# Patient Record
Sex: Male | Born: 1935 | Race: White | Hispanic: No | Marital: Married | Smoking: Former smoker
Health system: Southern US, Community
[De-identification: ages and names within clinical notes are randomized; demographics above are authoritative.]

## PROBLEM LIST (undated history)

## (undated) DIAGNOSIS — I10 Essential (primary) hypertension: Secondary | ICD-10-CM

## (undated) DIAGNOSIS — N400 Enlarged prostate without lower urinary tract symptoms: Secondary | ICD-10-CM

## (undated) DIAGNOSIS — I6529 Occlusion and stenosis of unspecified carotid artery: Secondary | ICD-10-CM

## (undated) DIAGNOSIS — Z800 Family history of malignant neoplasm of digestive organs: Secondary | ICD-10-CM

## (undated) DIAGNOSIS — M1990 Unspecified osteoarthritis, unspecified site: Secondary | ICD-10-CM

## (undated) HISTORY — DX: Family history of malignant neoplasm of digestive organs: Z80.0

---

## 2014-03-31 DIAGNOSIS — M1990 Unspecified osteoarthritis, unspecified site: Secondary | ICD-10-CM | POA: Insufficient documentation

## 2014-04-09 DIAGNOSIS — I10 Essential (primary) hypertension: Secondary | ICD-10-CM | POA: Insufficient documentation

## 2014-04-09 DIAGNOSIS — E7800 Pure hypercholesterolemia, unspecified: Secondary | ICD-10-CM | POA: Insufficient documentation

## 2015-09-22 DIAGNOSIS — I10 Essential (primary) hypertension: Secondary | ICD-10-CM | POA: Diagnosis not present

## 2015-09-22 DIAGNOSIS — N401 Enlarged prostate with lower urinary tract symptoms: Secondary | ICD-10-CM | POA: Diagnosis not present

## 2015-09-22 DIAGNOSIS — R739 Hyperglycemia, unspecified: Secondary | ICD-10-CM | POA: Diagnosis not present

## 2015-09-22 DIAGNOSIS — E782 Mixed hyperlipidemia: Secondary | ICD-10-CM | POA: Diagnosis not present

## 2015-09-30 DIAGNOSIS — R7301 Impaired fasting glucose: Secondary | ICD-10-CM | POA: Diagnosis not present

## 2015-09-30 DIAGNOSIS — I10 Essential (primary) hypertension: Secondary | ICD-10-CM | POA: Diagnosis not present

## 2015-09-30 DIAGNOSIS — M1991 Primary osteoarthritis, unspecified site: Secondary | ICD-10-CM | POA: Diagnosis not present

## 2015-09-30 DIAGNOSIS — E782 Mixed hyperlipidemia: Secondary | ICD-10-CM | POA: Diagnosis not present

## 2015-09-30 DIAGNOSIS — R972 Elevated prostate specific antigen [PSA]: Secondary | ICD-10-CM | POA: Diagnosis not present

## 2015-09-30 DIAGNOSIS — N401 Enlarged prostate with lower urinary tract symptoms: Secondary | ICD-10-CM | POA: Diagnosis not present

## 2016-03-25 DIAGNOSIS — I10 Essential (primary) hypertension: Secondary | ICD-10-CM | POA: Diagnosis not present

## 2016-03-25 DIAGNOSIS — M1991 Primary osteoarthritis, unspecified site: Secondary | ICD-10-CM | POA: Diagnosis not present

## 2016-03-25 DIAGNOSIS — R739 Hyperglycemia, unspecified: Secondary | ICD-10-CM | POA: Diagnosis not present

## 2016-03-25 DIAGNOSIS — E782 Mixed hyperlipidemia: Secondary | ICD-10-CM | POA: Diagnosis not present

## 2016-03-25 DIAGNOSIS — E7800 Pure hypercholesterolemia, unspecified: Secondary | ICD-10-CM | POA: Diagnosis not present

## 2016-03-30 DIAGNOSIS — I10 Essential (primary) hypertension: Secondary | ICD-10-CM | POA: Diagnosis not present

## 2016-03-30 DIAGNOSIS — Z6822 Body mass index (BMI) 22.0-22.9, adult: Secondary | ICD-10-CM | POA: Diagnosis not present

## 2016-03-30 DIAGNOSIS — R7301 Impaired fasting glucose: Secondary | ICD-10-CM | POA: Diagnosis not present

## 2016-03-30 DIAGNOSIS — R972 Elevated prostate specific antigen [PSA]: Secondary | ICD-10-CM | POA: Diagnosis not present

## 2016-03-30 DIAGNOSIS — E782 Mixed hyperlipidemia: Secondary | ICD-10-CM | POA: Diagnosis not present

## 2016-03-30 DIAGNOSIS — M1991 Primary osteoarthritis, unspecified site: Secondary | ICD-10-CM | POA: Diagnosis not present

## 2016-03-30 DIAGNOSIS — N401 Enlarged prostate with lower urinary tract symptoms: Secondary | ICD-10-CM | POA: Diagnosis not present

## 2016-05-18 DIAGNOSIS — Z23 Encounter for immunization: Secondary | ICD-10-CM | POA: Diagnosis not present

## 2016-05-18 DIAGNOSIS — M1611 Unilateral primary osteoarthritis, right hip: Secondary | ICD-10-CM | POA: Diagnosis not present

## 2016-05-18 DIAGNOSIS — Z6821 Body mass index (BMI) 21.0-21.9, adult: Secondary | ICD-10-CM | POA: Diagnosis not present

## 2016-05-21 DIAGNOSIS — M1611 Unilateral primary osteoarthritis, right hip: Secondary | ICD-10-CM | POA: Diagnosis not present

## 2016-05-21 DIAGNOSIS — Z6821 Body mass index (BMI) 21.0-21.9, adult: Secondary | ICD-10-CM | POA: Diagnosis not present

## 2016-05-23 DIAGNOSIS — M1611 Unilateral primary osteoarthritis, right hip: Secondary | ICD-10-CM | POA: Diagnosis not present

## 2016-05-23 DIAGNOSIS — Z6822 Body mass index (BMI) 22.0-22.9, adult: Secondary | ICD-10-CM | POA: Diagnosis not present

## 2016-06-01 DIAGNOSIS — M545 Low back pain: Secondary | ICD-10-CM | POA: Diagnosis not present

## 2016-06-01 DIAGNOSIS — M1611 Unilateral primary osteoarthritis, right hip: Secondary | ICD-10-CM | POA: Diagnosis not present

## 2016-06-08 DIAGNOSIS — M47816 Spondylosis without myelopathy or radiculopathy, lumbar region: Secondary | ICD-10-CM | POA: Diagnosis not present

## 2016-06-08 DIAGNOSIS — S336XXA Sprain of sacroiliac joint, initial encounter: Secondary | ICD-10-CM | POA: Diagnosis not present

## 2016-06-08 DIAGNOSIS — M9903 Segmental and somatic dysfunction of lumbar region: Secondary | ICD-10-CM | POA: Diagnosis not present

## 2016-06-10 DIAGNOSIS — M9903 Segmental and somatic dysfunction of lumbar region: Secondary | ICD-10-CM | POA: Diagnosis not present

## 2016-06-10 DIAGNOSIS — M47816 Spondylosis without myelopathy or radiculopathy, lumbar region: Secondary | ICD-10-CM | POA: Diagnosis not present

## 2016-06-10 DIAGNOSIS — S336XXA Sprain of sacroiliac joint, initial encounter: Secondary | ICD-10-CM | POA: Diagnosis not present

## 2016-06-15 DIAGNOSIS — M9903 Segmental and somatic dysfunction of lumbar region: Secondary | ICD-10-CM | POA: Diagnosis not present

## 2016-06-15 DIAGNOSIS — S336XXA Sprain of sacroiliac joint, initial encounter: Secondary | ICD-10-CM | POA: Diagnosis not present

## 2016-06-15 DIAGNOSIS — M47816 Spondylosis without myelopathy or radiculopathy, lumbar region: Secondary | ICD-10-CM | POA: Diagnosis not present

## 2016-06-17 DIAGNOSIS — M47816 Spondylosis without myelopathy or radiculopathy, lumbar region: Secondary | ICD-10-CM | POA: Diagnosis not present

## 2016-06-17 DIAGNOSIS — M9903 Segmental and somatic dysfunction of lumbar region: Secondary | ICD-10-CM | POA: Diagnosis not present

## 2016-06-17 DIAGNOSIS — S336XXA Sprain of sacroiliac joint, initial encounter: Secondary | ICD-10-CM | POA: Diagnosis not present

## 2016-06-20 DIAGNOSIS — M1991 Primary osteoarthritis, unspecified site: Secondary | ICD-10-CM | POA: Diagnosis not present

## 2016-06-20 DIAGNOSIS — R634 Abnormal weight loss: Secondary | ICD-10-CM | POA: Diagnosis not present

## 2016-06-20 DIAGNOSIS — R739 Hyperglycemia, unspecified: Secondary | ICD-10-CM | POA: Diagnosis not present

## 2016-06-20 DIAGNOSIS — I10 Essential (primary) hypertension: Secondary | ICD-10-CM | POA: Diagnosis not present

## 2016-06-20 DIAGNOSIS — Z6821 Body mass index (BMI) 21.0-21.9, adult: Secondary | ICD-10-CM | POA: Diagnosis not present

## 2016-06-21 DIAGNOSIS — M9903 Segmental and somatic dysfunction of lumbar region: Secondary | ICD-10-CM | POA: Diagnosis not present

## 2016-06-21 DIAGNOSIS — M47816 Spondylosis without myelopathy or radiculopathy, lumbar region: Secondary | ICD-10-CM | POA: Diagnosis not present

## 2016-06-21 DIAGNOSIS — S336XXA Sprain of sacroiliac joint, initial encounter: Secondary | ICD-10-CM | POA: Diagnosis not present

## 2016-06-28 DIAGNOSIS — M47816 Spondylosis without myelopathy or radiculopathy, lumbar region: Secondary | ICD-10-CM | POA: Diagnosis not present

## 2016-06-28 DIAGNOSIS — S336XXA Sprain of sacroiliac joint, initial encounter: Secondary | ICD-10-CM | POA: Diagnosis not present

## 2016-06-28 DIAGNOSIS — M9903 Segmental and somatic dysfunction of lumbar region: Secondary | ICD-10-CM | POA: Diagnosis not present

## 2016-07-05 DIAGNOSIS — M533 Sacrococcygeal disorders, not elsewhere classified: Secondary | ICD-10-CM | POA: Diagnosis not present

## 2016-07-19 DIAGNOSIS — M47816 Spondylosis without myelopathy or radiculopathy, lumbar region: Secondary | ICD-10-CM | POA: Diagnosis not present

## 2016-07-19 DIAGNOSIS — M533 Sacrococcygeal disorders, not elsewhere classified: Secondary | ICD-10-CM | POA: Diagnosis not present

## 2016-07-19 DIAGNOSIS — M5431 Sciatica, right side: Secondary | ICD-10-CM | POA: Diagnosis not present

## 2016-07-21 DIAGNOSIS — M545 Low back pain: Secondary | ICD-10-CM | POA: Diagnosis not present

## 2016-07-21 DIAGNOSIS — M47816 Spondylosis without myelopathy or radiculopathy, lumbar region: Secondary | ICD-10-CM | POA: Diagnosis not present

## 2016-07-21 DIAGNOSIS — M47817 Spondylosis without myelopathy or radiculopathy, lumbosacral region: Secondary | ICD-10-CM | POA: Diagnosis not present

## 2016-07-21 DIAGNOSIS — M79604 Pain in right leg: Secondary | ICD-10-CM | POA: Diagnosis not present

## 2016-07-21 DIAGNOSIS — M48061 Spinal stenosis, lumbar region without neurogenic claudication: Secondary | ICD-10-CM | POA: Diagnosis not present

## 2016-07-21 DIAGNOSIS — M4807 Spinal stenosis, lumbosacral region: Secondary | ICD-10-CM | POA: Diagnosis not present

## 2016-07-26 DIAGNOSIS — M5431 Sciatica, right side: Secondary | ICD-10-CM | POA: Diagnosis not present

## 2016-07-26 DIAGNOSIS — M47816 Spondylosis without myelopathy or radiculopathy, lumbar region: Secondary | ICD-10-CM | POA: Diagnosis not present

## 2016-08-09 DIAGNOSIS — M5416 Radiculopathy, lumbar region: Secondary | ICD-10-CM | POA: Diagnosis not present

## 2016-08-09 DIAGNOSIS — M5431 Sciatica, right side: Secondary | ICD-10-CM | POA: Diagnosis not present

## 2016-08-09 DIAGNOSIS — M545 Low back pain: Secondary | ICD-10-CM | POA: Diagnosis not present

## 2016-08-24 DIAGNOSIS — M47816 Spondylosis without myelopathy or radiculopathy, lumbar region: Secondary | ICD-10-CM | POA: Diagnosis not present

## 2016-08-24 DIAGNOSIS — M5416 Radiculopathy, lumbar region: Secondary | ICD-10-CM | POA: Diagnosis not present

## 2016-08-24 DIAGNOSIS — M5431 Sciatica, right side: Secondary | ICD-10-CM | POA: Diagnosis not present

## 2016-08-29 DIAGNOSIS — M1711 Unilateral primary osteoarthritis, right knee: Secondary | ICD-10-CM | POA: Diagnosis not present

## 2016-08-29 DIAGNOSIS — M545 Low back pain: Secondary | ICD-10-CM | POA: Diagnosis not present

## 2016-09-20 DIAGNOSIS — M5431 Sciatica, right side: Secondary | ICD-10-CM | POA: Diagnosis not present

## 2016-09-20 DIAGNOSIS — M47816 Spondylosis without myelopathy or radiculopathy, lumbar region: Secondary | ICD-10-CM | POA: Diagnosis not present

## 2016-09-28 DIAGNOSIS — E7800 Pure hypercholesterolemia, unspecified: Secondary | ICD-10-CM | POA: Diagnosis not present

## 2016-09-28 DIAGNOSIS — E782 Mixed hyperlipidemia: Secondary | ICD-10-CM | POA: Diagnosis not present

## 2016-09-28 DIAGNOSIS — I10 Essential (primary) hypertension: Secondary | ICD-10-CM | POA: Diagnosis not present

## 2016-09-28 DIAGNOSIS — R739 Hyperglycemia, unspecified: Secondary | ICD-10-CM | POA: Diagnosis not present

## 2016-09-29 DIAGNOSIS — E782 Mixed hyperlipidemia: Secondary | ICD-10-CM | POA: Insufficient documentation

## 2016-09-30 DIAGNOSIS — R630 Anorexia: Secondary | ICD-10-CM | POA: Diagnosis not present

## 2016-09-30 DIAGNOSIS — Z6821 Body mass index (BMI) 21.0-21.9, adult: Secondary | ICD-10-CM | POA: Diagnosis not present

## 2016-09-30 DIAGNOSIS — R972 Elevated prostate specific antigen [PSA]: Secondary | ICD-10-CM | POA: Diagnosis not present

## 2016-09-30 DIAGNOSIS — N401 Enlarged prostate with lower urinary tract symptoms: Secondary | ICD-10-CM | POA: Diagnosis not present

## 2016-09-30 DIAGNOSIS — R7301 Impaired fasting glucose: Secondary | ICD-10-CM | POA: Diagnosis not present

## 2016-09-30 DIAGNOSIS — R5383 Other fatigue: Secondary | ICD-10-CM | POA: Diagnosis not present

## 2016-09-30 DIAGNOSIS — M1991 Primary osteoarthritis, unspecified site: Secondary | ICD-10-CM | POA: Diagnosis not present

## 2016-09-30 DIAGNOSIS — D649 Anemia, unspecified: Secondary | ICD-10-CM | POA: Diagnosis not present

## 2016-09-30 DIAGNOSIS — I10 Essential (primary) hypertension: Secondary | ICD-10-CM | POA: Diagnosis not present

## 2016-09-30 DIAGNOSIS — Z1212 Encounter for screening for malignant neoplasm of rectum: Secondary | ICD-10-CM | POA: Diagnosis not present

## 2016-09-30 DIAGNOSIS — E782 Mixed hyperlipidemia: Secondary | ICD-10-CM | POA: Diagnosis not present

## 2016-10-06 DIAGNOSIS — M47816 Spondylosis without myelopathy or radiculopathy, lumbar region: Secondary | ICD-10-CM | POA: Diagnosis not present

## 2016-10-06 DIAGNOSIS — M5431 Sciatica, right side: Secondary | ICD-10-CM | POA: Diagnosis not present

## 2016-10-06 DIAGNOSIS — M5416 Radiculopathy, lumbar region: Secondary | ICD-10-CM | POA: Diagnosis not present

## 2016-10-06 DIAGNOSIS — M545 Low back pain: Secondary | ICD-10-CM | POA: Diagnosis not present

## 2016-10-25 DIAGNOSIS — M47816 Spondylosis without myelopathy or radiculopathy, lumbar region: Secondary | ICD-10-CM | POA: Diagnosis not present

## 2016-10-25 DIAGNOSIS — M5431 Sciatica, right side: Secondary | ICD-10-CM | POA: Diagnosis not present

## 2016-11-25 DIAGNOSIS — Z6821 Body mass index (BMI) 21.0-21.9, adult: Secondary | ICD-10-CM | POA: Diagnosis not present

## 2016-11-25 DIAGNOSIS — K921 Melena: Secondary | ICD-10-CM | POA: Diagnosis not present

## 2016-11-25 DIAGNOSIS — R634 Abnormal weight loss: Secondary | ICD-10-CM | POA: Diagnosis not present

## 2016-11-28 ENCOUNTER — Encounter: Payer: Self-pay | Admitting: Internal Medicine

## 2016-11-28 DIAGNOSIS — R634 Abnormal weight loss: Secondary | ICD-10-CM | POA: Diagnosis not present

## 2016-11-28 DIAGNOSIS — K921 Melena: Secondary | ICD-10-CM | POA: Diagnosis not present

## 2016-11-29 DIAGNOSIS — M48061 Spinal stenosis, lumbar region without neurogenic claudication: Secondary | ICD-10-CM | POA: Diagnosis not present

## 2016-11-29 DIAGNOSIS — M47816 Spondylosis without myelopathy or radiculopathy, lumbar region: Secondary | ICD-10-CM | POA: Diagnosis not present

## 2016-12-19 ENCOUNTER — Telehealth: Payer: Self-pay

## 2016-12-19 ENCOUNTER — Encounter: Payer: Self-pay | Admitting: Nurse Practitioner

## 2016-12-19 ENCOUNTER — Other Ambulatory Visit: Payer: Self-pay

## 2016-12-19 ENCOUNTER — Ambulatory Visit (INDEPENDENT_AMBULATORY_CARE_PROVIDER_SITE_OTHER): Payer: PPO | Admitting: Nurse Practitioner

## 2016-12-19 DIAGNOSIS — D649 Anemia, unspecified: Secondary | ICD-10-CM | POA: Diagnosis not present

## 2016-12-19 DIAGNOSIS — K625 Hemorrhage of anus and rectum: Secondary | ICD-10-CM | POA: Diagnosis not present

## 2016-12-19 DIAGNOSIS — R634 Abnormal weight loss: Secondary | ICD-10-CM | POA: Diagnosis not present

## 2016-12-19 MED ORDER — PEG 3350-KCL-NA BICARB-NACL 420 G PO SOLR: 4000.0000 mL | ORAL | 0 refills | Status: DC

## 2016-12-19 NOTE — Patient Instructions (Signed)
1. We will schedule your colonoscopy and possible upper endoscopy for you. 2. Have your blood work drawn when you're able to. 3. We will call you with your blood work results. 4. Return for follow-up in 3 months. 5. Call us if you have any worsening or severe symptoms.

## 2016-12-19 NOTE — Progress Notes (Signed)
Primary Care Physician:  Manon Hilding, MD Primary Gastroenterologist:  Dr. Gala Romney  Chief Complaint  Patient presents with  . Weight Loss  . Anemia    HPI:   [name] is a 83 y.o. male who presents On referral from primary care for weight loss and anemia as well as heme positive stool. PCP notes reviewed. The patient last saw primary care on 11/25/2016 for bad indigestion, heme positive stool 4 episodes in 1-1/2 hours which is described as "pure blood the first 3 times and nothing the fourth time." Was brighter with the first episode and darker the second and third times. Chronic indigestion. Has never had a colonoscopy. Labs included with PCP notes include normal TSH, CBC documenting anemia with a hemoglobin of 12.3.  Today he states he's doing well overall. Has not seen any rectal bleeding since the episode in early April. None before as well. He lost about 10 lb in 2 months but has gained it back. Does have significant fatigue. Admits he was told he was anemic. Denies melena, abdominal pain, N/V, GERD. Has a bowel movement about ever 2 days now on oxycodone, previously went every day prior to pain medications. Stools are not really harder but they are difficult to pass. Denies chest pain, dyspnea, dizziness, lightheadedness, syncope, near syncope. Denies any other upper or lower GI symptoms.  Has never had a colonoscopy before.  No past medical history on file.  No past surgical history on file.  Current Outpatient Prescriptions  Medication Sig Dispense Refill  . aspirin 325 MG tablet Take 1 tablet by mouth daily.    Marland Kitchen HYDROcodone-acetaminophen (NORCO) 7.5-325 MG tablet Take 1 tablet by mouth every 6 (six) hours as needed.    Marland Kitchen losartan (COZAAR) 100 MG tablet Take 100 mg by mouth daily.    . metoprolol (LOPRESSOR) 50 MG tablet metoprolol tartrate 50 mg tablet    . Omega-3 Fatty Acids (FISH OIL) 1000 MG CAPS Take 1 tablet by mouth daily.     No current facility-administered  medications for this visit.     Allergies as of 12/19/2016  . (No Known Allergies)    Family History  Problem Relation Age of Onset  . Colon cancer Neg Hx   . Colon polyps Neg Hx     Social History   Social History  . Marital status: Married    Spouse name: N/A  . Number of children: N/A  . Years of education: N/A   Occupational History  . Not on file.   Social History Main Topics  . Smoking status: Former Smoker    Quit date: 1980  . Smokeless tobacco: Never Used  . Alcohol use No  . Drug use: No  . Sexual activity: No   Other Topics Concern  . Not on file   Social History Narrative  . No narrative on file    Review of Systems: General: Negative for anorexia, fever, chills, fatigue, weakness. Eyes: Negative for vision changes.  ENT: Negative for hoarseness, difficulty swallowing. CV: Negative for chest pain, angina, palpitations, peripheral edema.  Respiratory: Negative for dyspnea at rest, cough, sputum, wheezing.  GI: See history of present illness. MS: Pain from bulging disc.  Derm: Negative for rash or itching.  Endo: Negative for unusual weight change.  Heme: Negative for bruising or bleeding. Allergy: Negative for rash or hives.    Physical Exam: BP (!) 153/75   Pulse 63   Temp 98 F (36.7 C) (Oral)  Ht 5\' 11"  (1.803 m)   Wt 151 lb 12.8 oz (68.9 kg)   BMI 21.17 kg/m  General:   Alert and oriented. Pleasant and cooperative. Well-nourished and well-developed. Young appearing/acting 83 years old. Head:  Normocephalic and atraumatic. Eyes:  Without icterus, sclera clear and conjunctiva pink.  Ears:  Normal auditory acuity. Cardiovascular:  S1, S2 present without murmurs appreciated. Normal pulses noted. Extremities without clubbing or edema. Respiratory:  Clear to auscultation bilaterally. No wheezes, rales, or rhonchi. No distress.  Gastrointestinal:  +BS, soft, non-tender and non-distended. No HSM noted. No guarding or rebound. No masses  appreciated.  Rectal:  Deferred  Musculoskalatal:  Symmetrical without gross deformities. Skin:  Intact without significant lesions or rashes. Neurologic:  Alert and oriented x4;  grossly normal neurologically. Psych:  Alert and cooperative. Normal mood and affect. Heme/Lymph/Immune: No excessive bruising noted.    12/19/2016 9:58 AM   Disclaimer: This note was dictated with voice recognition software. Similar sounding words can inadvertently be transcribed and may not be corrected upon review.

## 2016-12-19 NOTE — Assessment & Plan Note (Signed)
The patient notes he did have about a 10 pound weight loss over 23 months, but feels he has put this weight back on. In the setting of anemia, rectal bleeding, his weight loss is also concerning. May have been due to dietary changes or incidental weight loss and regain. Colonoscopy and possible upper endoscopy as per below for further evaluation. Return for follow-up in 3 months. Continue to monitor her weight

## 2016-12-19 NOTE — Assessment & Plan Note (Addendum)
The patient has noted anemia with a hemoglobin of 12.3. He has had 3 episodes of rectal bleeding. His anemia is normocytic and normochromic which does not appear to be due to chronic blood loss. I will recheck his CBC today as well as iron studies. Because of his anemia we will add on the possibility of upper endoscopy, if needed as determined on the day of procedure if no further explanation for anemia found on colonoscopy. He was previously taking ibuprofen. He currently takes aspirin 325 mg daily. There is a possibility of NSAID gastritis, gastric ulcer, duodenal ulcer, duodenitis, gastritis. Less likely gastric cancer. Return for follow-up in 3 months.  Proceed with +/- EGD at the time of colonoscopy with Dr. Gala Romney in near future: the risks, benefits, and alternatives have been discussed with the patient in detail. The patient states understanding and desires to proceed.   The patient is currently on hydrocodone 7.5 mg. No other anticoagulants, anxiolytics, chronic pain medications, or antidepressants. He is 83 years old and small in stature. Conscious sedation should be likely adequate for his procedure despite pain medicines which she takes about twice a day.

## 2016-12-19 NOTE — Telephone Encounter (Signed)
Received fax from [facility]. TCS/+/-EGD approved. Auth# [id number], 01/19/17-04/19/17.

## 2016-12-19 NOTE — Assessment & Plan Note (Addendum)
The patient has had rectal bleeding for 3 episodes in 1 day. No bleeding prior and no bleeding after. Does have some mild constipation symptoms. He is also found to be a little bit anemic and initially had about 10 pound weight loss in 2-3 months but states he has gained this back. He is never had a colonoscopy before. Rectal bleeding differentials include hemorrhoid bleeding in the setting of constipation, other benign anorectal source. However, unable to exclude polyps, colorectal cancer, or other more insidious pathology. We will plan for colonoscopy at this time.  Proceed with TCS with Dr. Gala Romney in near future: the risks, benefits, and alternatives have been discussed with the patient in detail. The patient states understanding and desires to proceed.  The patient is currently on hydrocodone 7.5 mg. No other anticoagulants, anxiolytics, chronic pain medications, or antidepressants. He is 83 years old and small in stature. Conscious sedation should be likely adequate for his procedure despite pain medicines which she takes about twice a day.

## 2016-12-19 NOTE — Telephone Encounter (Signed)
PA info for TCS/+/-EGD faxed to [facility] [employer].

## 2016-12-19 NOTE — Progress Notes (Signed)
cc'ed to pcp °

## 2016-12-29 ENCOUNTER — Telehealth: Payer: Self-pay | Admitting: Nurse Practitioner

## 2016-12-29 NOTE — Telephone Encounter (Signed)
Please tell the patient received his labs back. His blood cell counts looked normal with a normal hemoglobin, no sign of infection, normal iron studies. Keep plan for scheduled colonoscopy. Call if any questions or concerns.

## 2017-01-04 NOTE — Telephone Encounter (Signed)
Patient made aware of EG's recommendations

## 2017-01-19 ENCOUNTER — Ambulatory Visit (HOSPITAL_COMMUNITY)
Admission: RE | Admit: 2017-01-19 | Discharge: 2017-01-19 | Disposition: A | Discharge status: Home / Self Care | Payer: PPO | Source: Ambulatory Visit | Attending: Internal Medicine | Admitting: Internal Medicine

## 2017-01-19 ENCOUNTER — Encounter (HOSPITAL_COMMUNITY): Payer: Self-pay

## 2017-01-19 ENCOUNTER — Encounter (HOSPITAL_COMMUNITY)
Admission: RE | Disposition: A | Discharge status: Home / Self Care | Payer: Self-pay | Source: Ambulatory Visit | Attending: Internal Medicine

## 2017-01-19 DIAGNOSIS — K3189 Other diseases of stomach and duodenum: Secondary | ICD-10-CM

## 2017-01-19 DIAGNOSIS — K921 Melena: Secondary | ICD-10-CM

## 2017-01-19 DIAGNOSIS — K254 Chronic or unspecified gastric ulcer with hemorrhage: Secondary | ICD-10-CM | POA: Diagnosis not present

## 2017-01-19 DIAGNOSIS — I10 Essential (primary) hypertension: Secondary | ICD-10-CM | POA: Diagnosis not present

## 2017-01-19 DIAGNOSIS — Z87891 Personal history of nicotine dependence: Secondary | ICD-10-CM | POA: Diagnosis not present

## 2017-01-19 DIAGNOSIS — Z79899 Other long term (current) drug therapy: Secondary | ICD-10-CM | POA: Diagnosis not present

## 2017-01-19 DIAGNOSIS — Z8371 Family history of colonic polyps: Secondary | ICD-10-CM | POA: Diagnosis not present

## 2017-01-19 DIAGNOSIS — K625 Hemorrhage of anus and rectum: Secondary | ICD-10-CM

## 2017-01-19 DIAGNOSIS — K2950 Unspecified chronic gastritis without bleeding: Secondary | ICD-10-CM | POA: Diagnosis not present

## 2017-01-19 DIAGNOSIS — Z7982 Long term (current) use of aspirin: Secondary | ICD-10-CM | POA: Diagnosis not present

## 2017-01-19 DIAGNOSIS — K5730 Diverticulosis of large intestine without perforation or abscess without bleeding: Secondary | ICD-10-CM | POA: Insufficient documentation

## 2017-01-19 DIAGNOSIS — R634 Abnormal weight loss: Secondary | ICD-10-CM | POA: Diagnosis not present

## 2017-01-19 DIAGNOSIS — D649 Anemia, unspecified: Secondary | ICD-10-CM | POA: Insufficient documentation

## 2017-01-19 DIAGNOSIS — B9681 Helicobacter pylori [H. pylori] as the cause of diseases classified elsewhere: Secondary | ICD-10-CM | POA: Insufficient documentation

## 2017-01-19 HISTORY — PX: COLONOSCOPY: SHX5424

## 2017-01-19 HISTORY — DX: Essential (primary) hypertension: I10

## 2017-01-19 HISTORY — PX: ESOPHAGOGASTRODUODENOSCOPY: SHX5428

## 2017-01-19 SURGERY — COLONOSCOPY
Anesthesia: Moderate Sedation

## 2017-01-19 MED ORDER — MIDAZOLAM HCL 5 MG/5ML IJ SOLN
INTRAMUSCULAR | Status: DC | PRN
Start: 2017-01-19 — End: 2017-01-19
  Administered 2017-01-19: 2 mg via INTRAVENOUS
  Administered 2017-01-19 (×3): 1 mg via INTRAVENOUS

## 2017-01-19 MED ORDER — ONDANSETRON HCL 4 MG/2ML IJ SOLN
INTRAMUSCULAR | Status: AC
  Filled 2017-01-19: qty 2

## 2017-01-19 MED ORDER — MEPERIDINE HCL 100 MG/ML IJ SOLN
INTRAMUSCULAR | Status: DC | PRN
  Administered 2017-01-19 (×2): 25 mg via INTRAVENOUS
  Administered 2017-01-19: 50 mg via INTRAVENOUS

## 2017-01-19 MED ORDER — STERILE WATER FOR IRRIGATION IR SOLN
Status: DC | PRN
  Administered 2017-01-19: 13:00:00

## 2017-01-19 MED ORDER — MIDAZOLAM HCL 5 MG/5ML IJ SOLN
INTRAMUSCULAR | Status: AC
  Filled 2017-01-19: qty 10

## 2017-01-19 MED ORDER — ONDANSETRON HCL 4 MG/2ML IJ SOLN
INTRAMUSCULAR | Status: DC | PRN
  Administered 2017-01-19: 4 mg via INTRAVENOUS

## 2017-01-19 MED ORDER — LIDOCAINE VISCOUS 2 % MT SOLN
OROMUCOSAL | Status: DC | PRN
  Administered 2017-01-19: 5 mL via OROMUCOSAL

## 2017-01-19 MED ORDER — LIDOCAINE VISCOUS 2 % MT SOLN
OROMUCOSAL | Status: AC
  Filled 2017-01-19: qty 15

## 2017-01-19 MED ORDER — SODIUM CHLORIDE 0.9 % IV SOLN
INTRAVENOUS | Status: DC
  Administered 2017-01-19: 1000 mL via INTRAVENOUS

## 2017-01-19 MED ORDER — MEPERIDINE HCL 100 MG/ML IJ SOLN
INTRAMUSCULAR | Status: AC
  Filled 2017-01-19: qty 2

## 2017-01-19 NOTE — H&P (Signed)
@  HFWY@   Primary Care Physician:  Manon Hilding, MD Primary Gastroenterologist:  Dr. Gala Romney  Pre-Procedure History & Physical: HPI:  Andrew [name] is a 83 y.o. male here for for evaluation of self-limiting hematochezia. No bleeding since seen in the office recently. He is here for colonoscopy and possible EGD.  Past Medical History:  Diagnosis Date  . Hypertension     History reviewed. No pertinent surgical history.  Prior to Admission medications   Medication Sig Start Date End Date Taking? Authorizing Provider  aspirin 325 MG tablet Take 325 mg by mouth daily.    Yes [provider]  HYDROcodone-acetaminophen (NORCO) 7.5-325 MG tablet Take 1 tablet by mouth 2 (two) times daily as needed for moderate pain.  12/15/16  Yes [provider]  losartan (COZAAR) 100 MG tablet Take 100 mg by mouth at bedtime.    Yes [provider]  metoprolol (LOPRESSOR) 50 MG tablet Take 50 mgs by mouth once daily 09/30/16 09/24/17 Yes [provider]  Omega-3 Fatty Acids (FISH OIL) 1000 MG CAPS Take 1,000 mg by mouth 3 (three) times daily.    Yes [provider]  polyethylene glycol-electrolytes (TRILYTE) 420 g solution Take 4,000 mLs by mouth as directed. 12/19/16  Yes Daneil Dolin, MD    Allergies as of 12/19/2016  . (No Known Allergies)    Family History  Problem Relation Age of Onset  . Colon cancer Neg Hx   . Colon polyps Neg Hx     Social History   Social History  . Marital status: Married    Spouse name: N/A  . Number of children: N/A  . Years of education: N/A   Occupational History  . Not on file.   Social History Main Topics  . Smoking status: Former Smoker    Quit date: 1980  . Smokeless tobacco: Never Used  . Alcohol use No  . Drug use: No  . Sexual activity: No   Other Topics Concern  . Not on file   Social History Narrative  . No narrative on file    Review of Systems: See HPI, otherwise negative ROS  Physical  Exam: BP (!) 183/80   Pulse 79   Temp 98.7 F (37.1 C) (Oral)   Resp 16   SpO2 100%  General:   Alert,  Well-developed, well-nourished, pleasant and cooperative in NAD Lungs:  Clear throughout to auscultation.   No wheezes, crackles, or rhonchi. No acute distress. Heart:  Regular rate and rhythm; no murmurs, clicks, rubs,  or gallops. Abdomen: Non-distended, normal bowel sounds.  Soft and nontender without appreciable mass or hepatosplenomegaly.  Pulses:  Normal pulses noted. Extremities:  Without clubbing or edema.  Impression:  Pleasant 83 year old gentleman with self-limiting bout of rectal bleeding. He is anemic.  Plan for colonoscopy and possible EGD to follow for office visit.   Recommendations:  I have offered the patient a colonoscopy and possible EGD to follow.  The risks, benefits, limitations, imponderables and alternatives regarding both EGD and colonoscopy have been reviewed with the patient. Questions have been answered. All parties agreeable.          Notice: This dictation was prepared with Dragon dictation along with smaller phrase technology. Any transcriptional errors that result from this process are unintentional and may not be corrected upon review.

## 2017-01-19 NOTE — Discharge Instructions (Addendum)
°Colonoscopy °Discharge Instructions ° °Read the instructions outlined below and refer to this sheet in the next few weeks. These discharge instructions provide you with general information on caring for yourself after you leave the hospital. Your doctor may also give you specific instructions. While your treatment has been planned according to the most current medical practices available, unavoidable complications occasionally occur. If you have any problems or questions after discharge, call Dr. Rourk at [phone]. °ACTIVITY °· You may resume your regular activity, but move at a slower pace for the next 24 hours.  °· Take frequent rest periods for the next 24 hours.  °· Walking will help get rid of the air and reduce the bloated feeling in your belly (abdomen).  °· No driving for 24 hours (because of the medicine (anesthesia) used during the test).   °· Do not sign any important legal documents or operate any machinery for 24 hours (because of the anesthesia used during the test).  °NUTRITION °· Drink plenty of fluids.  °· You may resume your normal diet as instructed by your doctor.  °· Begin with a light meal and progress to your normal diet. Heavy or fried foods are harder to digest and may make you feel sick to your stomach (nauseated).  °· Avoid alcoholic beverages for 24 hours or as instructed.  °MEDICATIONS °· You may resume your normal medications unless your doctor tells you otherwise.  °WHAT YOU CAN EXPECT TODAY °· Some feelings of bloating in the abdomen.  °· Passage of more gas than usual.  °· Spotting of blood in your stool or on the toilet paper.  °IF YOU HAD POLYPS REMOVED DURING THE COLONOSCOPY: °· No aspirin products for 7 days or as instructed.  °· No alcohol for 7 days or as instructed.  °· Eat a soft diet for the next 24 hours.  °FINDING OUT THE RESULTS OF YOUR TEST °Not all test results are available during your visit. If your test results are not back during the visit, make an appointment  with your caregiver to find out the results. Do not assume everything is normal if you have not heard from your caregiver or the medical facility. It is important for you to follow up on all of your test results.  °SEEK IMMEDIATE MEDICAL ATTENTION IF: °· You have more than a spotting of blood in your stool.  °· Your belly is swollen (abdominal distention).  °· You are nauseated or vomiting.  °· You have a temperature over 101.  °· You have abdominal pain or discomfort that is severe or gets worse throughout the day.  °EGD °Discharge instructions °Please read the instructions outlined below and refer to this sheet in the next few weeks. These discharge instructions provide you with general information on caring for yourself after you leave the hospital. Your doctor may also give you specific instructions. While your treatment has been planned according to the most current medical practices available, unavoidable complications occasionally occur. If you have any problems or questions after discharge, please call your doctor. °ACTIVITY °· You may resume your regular activity but move at a slower pace for the next 24 hours.  °· Take frequent rest periods for the next 24 hours.  °· Walking will help expel (get rid of) the air and reduce the bloated feeling in your abdomen.  °· No driving for 24 hours (because of the anesthesia (medicine) used during the test).  °· You may shower.  °· Do not sign any important   legal documents or operate any machinery for 24 hours (because of the anesthesia used during the test).  NUTRITION  Drink plenty of fluids.   You may resume your normal diet.   Begin with a light meal and progress to your normal diet.   Avoid alcoholic beverages for 24 hours or as instructed by your caregiver.  MEDICATIONS  You may resume your normal medications unless your caregiver tells you otherwise.  WHAT YOU CAN EXPECT TODAY  You may experience abdominal discomfort such as a feeling of fullness  or gas pains.  FOLLOW-UP  Your doctor will discuss the results of your test with you.  SEEK IMMEDIATE MEDICAL ATTENTION IF ANY OF THE FOLLOWING OCCUR:  Excessive nausea (feeling sick to your stomach) and/or vomiting.   Severe abdominal pain and distention (swelling).   Trouble swallowing.   Temperature over 101 F (37.8 C).   Rectal bleeding or vomiting of blood.    Peptic ulcer disease information provided  Please avoid NSAIDS like Advil or Aleve and ibuprofen  Begin Protonix 40 mg twice daily  I do not recommend future colonoscopy unless the symptoms developed  Office visit with [place] in 3 months   Peptic Ulcer A peptic ulcer is a painful sore in the lining of your esophagus, stomach, or the first part of your small intestine. You may have pain in the area between your chest and your belly button. The most common causes of an ulcer are:  An infection.  Using certain pain medicines too often or too much.  Follow these instructions at home:  Avoid alcohol.  Avoid caffeine.  Do not use any tobacco products. These include cigarettes, chewing tobacco, and e-cigarettes. If you need help quitting, ask your doctor.  Take over-the-counter and prescription medicines only as told by your doctor. Do not stop or change your medicines unless you talk with your doctor about it first.  Keep all follow-up visits as told by your doctor. This is important. Contact a doctor if:  You do not get better in 7 days after you start treatment.  You keep having an upset stomach (indigestion) or heartburn. Get help right away if:  You have sudden, sharp pain in your belly (abdomen).  You have lasting belly pain.  You have bloody poop (stool) or black, tarry poop.  You throw up (vomit) blood. It may look like coffee grounds.  You feel light-headed or feel like you may pass out (faint).  You get weak.  You get sweaty or feel sticky and cold to the touch (clammy). This information  is not intended to replace advice given to you by your health care provider. Make sure you discuss any questions you have with your health care provider. Document Released: 11/02/2009 Document Revised: 12/23/2015 Document Reviewed: 05/09/2015 Elsevier Interactive Patient Education  [employer].

## 2017-01-19 NOTE — Op Note (Signed)
[facility] Patient Name: Andrew [name] Procedure Date: 01/19/2017 11:32 AM MRN: [id number] Date of Birth: Mar 23, 1931 Attending MD: Norvel Richards , MD CSN: [id number] Age: 83 Admit Type: Outpatient Procedure:                Upper GI endoscopy Indications:              Weight loss Providers:                Norvel Richards, MD, Hinton Rao, RN, Aram Candela Referring MD:              Medicines:                Midazolam 7 mg IV, Meperidine 125 mg IV,                            Ondansetron 4 mg IV Complications:            No immediate complications. Estimated Blood Loss:     Estimated blood loss was minimal. Procedure:                Pre-Anesthesia Assessment:                           - Prior to the procedure, a History and Physical                            was performed, and patient medications and                            allergies were reviewed. The patient's tolerance of                            previous anesthesia was also reviewed. The risks                            and benefits of the procedure and the sedation                            options and risks were discussed with the patient.                            All questions were answered, and informed consent                            was obtained. Prior Anticoagulants: The patient has                            taken no previous anticoagulant or antiplatelet                            agents. ASA Grade Assessment: III - A patient with  severe systemic disease. After reviewing the risks                            and benefits, the patient was deemed in                            satisfactory condition to undergo the procedure.                           After obtaining informed consent, the endoscope was                            passed under direct vision. Throughout the                            procedure, the patient's blood pressure, pulse,  and                            oxygen saturations were monitored continuously. The                            [id number] ([id number]) scope was introduced through the                            mouth, and advanced to the second part of duodenum.                            The upper GI endoscopy was accomplished without                            difficulty. The patient tolerated the procedure                            well. The upper GI endoscopy was accomplished                            without difficulty. The patient tolerated the                            procedure well. Scope In: 1:21:34 PM Scope Out: 1:25:58 PM Total Procedure Duration: 0 hours 4 minutes 24 seconds  Findings:      The examined esophagus was normal.      Localized moderate mucosal changes were found in the stomach. multiple 3       mm antral ulcers found satellite erosions. Pylorus patent. This was       biopsied with a cold forceps for histology. Estimated blood loss was       minimal.      mucosal changes were found in the duodenal bulb. (2) 5 mm linear ulcers       with satellite erosions found in the bulb Impression:               - Normal esophagus.                           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 Status post gastric biopsy..                           - Mucosal changes in the duodenum.                           - Moderate Sedation:      Moderate (conscious) sedation was administered by the endoscopy nurse       and supervised by the endoscopist. The following parameters were       monitored: oxygen saturation, heart rate, blood pressure, respiratory       rate, EKG, adequacy of pulmonary ventilation, and response to care.       Total physician intraservice time was 20 minutes. Recommendation:           - Patient has a contact number available for                            emergencies. The signs and symptoms of potential                             delayed complications were discussed with the                            patient. Return to normal activities tomorrow.                            Written discharge instructions were provided to the                            patient.                           - Resume previous diet.                           - Patient has a contact number available for                            emergencies. The signs and symptoms of potential                            delayed complications were discussed with the                            patient. Return to normal activities tomorrow.                            Written discharge instructions were provided to the                            patient.                           -  Resume previous diet.                           - Continue present medications.                           - Await pathology results. Avoid nonsteroidal                            agents.                           - Repeat upper endoscopy in 3 months for                            surveillance.                           - Return to GI office in 12 weeks. Procedure Code(s):        --- Professional ---                           [phone], Esophagogastroduodenoscopy, flexible,                            transoral; with biopsy, single or multiple                           99152, Moderate sedation services provided by the                            same physician or other qualified health care                            professional performing the diagnostic or                            therapeutic service that the sedation supports,                            requiring the presence of an independent trained                            observer to assist in the monitoring of the                            patient's level of consciousness and physiological                            status; initial 15 minutes of intraservice time,                            patient age 81 years or  older Diagnosis Code(s):        --- Professional ---  K31.89, Other diseases of stomach and duodenum                           R63.4, Abnormal weight loss CPT copyright 2016 American Medical Association. All rights reserved. The codes documented in this report are preliminary and upon coder review may  be revised to meet current compliance requirements. Cristopher Estimable. Rourk, MD Norvel Richards, MD 01/19/2017 2:37:06 PM This report has been signed electronically. Number of Addenda: 0

## 2017-01-19 NOTE — Op Note (Addendum)
[facility] Patient Name: Andrew [name] Procedure Date: 01/19/2017 12:32 PM MRN: [id number] Date of Birth: [birth year] Attending MD: Norvel Richards , MD CSN: [id number] Age: 83 Admit Type: Outpatient Procedure:                Colonoscopy Indications:              Hematochezia Providers:                Norvel Richards, MD, Hinton Rao, RN, Aram Candela Referring MD:              Medicines:                Midazolam 4 mg IV, Meperidine 782 mg IV Complications:            No immediate complications. Estimated Blood Loss:     Estimated blood loss: none. Procedure:                Pre-Anesthesia Assessment:                           - Prior to the procedure, a History and Physical                            was performed, and patient medications and                            allergies were reviewed. The patient's tolerance of                            previous anesthesia was also reviewed. The risks                            and benefits of the procedure and the sedation                            options and risks were discussed with the patient.                            All questions were answered, and informed consent                            was obtained. Prior Anticoagulants: The patient has                            taken no previous anticoagulant or antiplatelet                            agents. ASA Grade Assessment: III - A patient with                            severe systemic disease. After reviewing the risks  and benefits, the patient was deemed in                            satisfactory condition to undergo the procedure.                           After obtaining informed consent, the colonoscope                            was passed under direct vision. Throughout the                            procedure, the patient's blood pressure, pulse, and                            oxygen saturations were  monitored continuously. The                            EC-[id number] ([id number]) scope was introduced through                            the anus and advanced to the the cecum, identified                            by appendiceal orifice and ileocecal valve. The                            ileocecal valve, appendiceal orifice, and rectum                            were photographed. The entire colon was well                            visualized. The quality of the bowel preparation                            was adequate. Scope In: 1:03:14 PM Scope Out: 1:16:05 PM Scope Withdrawal Time: 0 hours 7 minutes 46 seconds  Total Procedure Duration: 0 hours 12 minutes 51 seconds  Findings:      The perianal and digital rectal examinations were normal.      Scattered small and large-mouthed diverticula were found in the entire       colon.      The exam was otherwise without abnormality on direct and retroflexion       views. Impression:               - Diverticulosis in the entire examined colon.                           - The examination was otherwise normal on direct                            and retroflexion views.                           -  No specimens collected. Moderate Sedation:      Moderate (conscious) sedation was administered by the endoscopy nurse       and supervised by the endoscopist. The following parameters were       monitored: oxygen saturation, heart rate, blood pressure, respiratory       rate, EKG, adequacy of pulmonary ventilation, and response to care.       Total physician intraservice time was 24 minutes. Recommendation:           - Patient has a contact number available for                            emergencies. The signs and symptoms of potential                            delayed complications were discussed with the                            patient. Return to normal activities tomorrow.                            Written discharge instructions were provided to  the                            patient.                           - Resume previous diet.                           - Continue present medications.                           - No repeat colonoscopy due to age.                           - Return to GI clinic (date not yet determined).                            See EGD report. Procedure Code(s):        --- Professional ---                           [phone], Colonoscopy, flexible; diagnostic, including                            collection of specimen(s) by brushing or washing,                            when performed (separate procedure)                           99152, Moderate sedation services provided by the                            same physician or other qualified health care  professional performing the diagnostic or                            therapeutic service that the sedation supports,                            requiring the presence of an independent trained                            observer to assist in the monitoring of the                            patient's level of consciousness and physiological                            status; initial 15 minutes of intraservice time,                            patient age 50 years or older                           [phone], Moderate sedation services; each additional                            15 minutes intraservice time Diagnosis Code(s):        --- Professional ---                           K92.1, Melena (includes Hematochezia)                           K57.30, Diverticulosis of large intestine without                            perforation or abscess without bleeding CPT copyright 2016 [employer]. All rights reserved. The codes documented in this report are preliminary and upon coder review may  be revised to meet current compliance requirements. Cristopher Estimable. Latoia Eyster, MD Norvel Richards, MD 01/19/2017 1:20:50 PM This report has been  signed electronically. Number of Addenda: 0

## 2017-01-20 ENCOUNTER — Encounter (HOSPITAL_COMMUNITY): Payer: Self-pay | Admitting: Internal Medicine

## 2017-01-22 ENCOUNTER — Encounter: Payer: Self-pay | Admitting: Internal Medicine

## 2017-01-24 ENCOUNTER — Telehealth: Payer: Self-pay

## 2017-01-24 DIAGNOSIS — E782 Mixed hyperlipidemia: Secondary | ICD-10-CM | POA: Diagnosis not present

## 2017-01-24 DIAGNOSIS — I10 Essential (primary) hypertension: Secondary | ICD-10-CM | POA: Diagnosis not present

## 2017-01-24 DIAGNOSIS — E7800 Pure hypercholesterolemia, unspecified: Secondary | ICD-10-CM | POA: Diagnosis not present

## 2017-01-24 DIAGNOSIS — D649 Anemia, unspecified: Secondary | ICD-10-CM | POA: Diagnosis not present

## 2017-01-24 DIAGNOSIS — R739 Hyperglycemia, unspecified: Secondary | ICD-10-CM | POA: Diagnosis not present

## 2017-01-24 NOTE — Telephone Encounter (Signed)
Per RMR- Send letter to patient.  Send copy of letter with path to referring provider and PCP.  Patient needs PrevPak or generic equivalent x 14 days--hold any acid suppression and/or statin therapy patient may be taking during treatment. So, hold bid protonix, then resume after treatment.   Ov 3 mos

## 2017-01-24 NOTE — Telephone Encounter (Signed)
Letter mailed to the pt. 

## 2017-01-24 NOTE — Telephone Encounter (Signed)
PATIENT SCHEDULED FOR APPOINTMENT  °

## 2017-01-26 DIAGNOSIS — R7301 Impaired fasting glucose: Secondary | ICD-10-CM | POA: Diagnosis not present

## 2017-01-26 DIAGNOSIS — I10 Essential (primary) hypertension: Secondary | ICD-10-CM | POA: Diagnosis not present

## 2017-01-26 DIAGNOSIS — R630 Anorexia: Secondary | ICD-10-CM | POA: Diagnosis not present

## 2017-01-26 DIAGNOSIS — R972 Elevated prostate specific antigen [PSA]: Secondary | ICD-10-CM | POA: Diagnosis not present

## 2017-01-26 DIAGNOSIS — N401 Enlarged prostate with lower urinary tract symptoms: Secondary | ICD-10-CM | POA: Diagnosis not present

## 2017-01-26 DIAGNOSIS — R5383 Other fatigue: Secondary | ICD-10-CM | POA: Diagnosis not present

## 2017-01-26 DIAGNOSIS — D649 Anemia, unspecified: Secondary | ICD-10-CM | POA: Diagnosis not present

## 2017-01-26 DIAGNOSIS — E782 Mixed hyperlipidemia: Secondary | ICD-10-CM | POA: Diagnosis not present

## 2017-01-26 MED ORDER — AMOXICILL-CLARITHRO-LANSOPRAZ PO MISC: Freq: Two times a day (BID) | ORAL | 0 refills | Status: DC

## 2017-01-26 NOTE — Telephone Encounter (Signed)
Procedure notes and path report faxed to Dr Edythe Lynn office

## 2017-01-26 NOTE — Telephone Encounter (Signed)
Pt is aware. rx has been sent to the pharmacy. I advised pt he does not need to take the protonix while he is taking the prevpac and if there is any insurance issue to let me know or have the pharmacy let me know.   Manuela Schwartz, Dr.Sasser told [facility] that he did not receive the procedure note and path from the hospital. RMR wanted to know if you would print it out and fax it to their office?

## 2017-03-03 DIAGNOSIS — H2513 Age-related nuclear cataract, bilateral: Secondary | ICD-10-CM | POA: Diagnosis not present

## 2017-03-03 DIAGNOSIS — H40033 Anatomical narrow angle, bilateral: Secondary | ICD-10-CM | POA: Diagnosis not present

## 2017-03-13 ENCOUNTER — Encounter (HOSPITAL_COMMUNITY): Payer: Self-pay | Admitting: Emergency Medicine

## 2017-03-13 ENCOUNTER — Emergency Department (HOSPITAL_COMMUNITY)
Admission: EM | Admit: 2017-03-13 | Discharge: 2017-03-13 | Disposition: A | Discharge status: Home / Self Care | Payer: PPO | Attending: Emergency Medicine | Admitting: Emergency Medicine

## 2017-03-13 ENCOUNTER — Emergency Department (HOSPITAL_COMMUNITY): Payer: PPO

## 2017-03-13 DIAGNOSIS — S60412A Abrasion of right middle finger, initial encounter: Secondary | ICD-10-CM | POA: Diagnosis not present

## 2017-03-13 DIAGNOSIS — S022XXA Fracture of nasal bones, initial encounter for closed fracture: Secondary | ICD-10-CM | POA: Insufficient documentation

## 2017-03-13 DIAGNOSIS — S0232XA Fracture of orbital floor, left side, initial encounter for closed fracture: Secondary | ICD-10-CM | POA: Insufficient documentation

## 2017-03-13 DIAGNOSIS — S60414A Abrasion of right ring finger, initial encounter: Secondary | ICD-10-CM | POA: Insufficient documentation

## 2017-03-13 DIAGNOSIS — R220 Localized swelling, mass and lump, head: Secondary | ICD-10-CM | POA: Diagnosis not present

## 2017-03-13 DIAGNOSIS — Z87891 Personal history of nicotine dependence: Secondary | ICD-10-CM | POA: Insufficient documentation

## 2017-03-13 DIAGNOSIS — Y92410 Unspecified street and highway as the place of occurrence of the external cause: Secondary | ICD-10-CM | POA: Insufficient documentation

## 2017-03-13 DIAGNOSIS — S139XXA Sprain of joints and ligaments of unspecified parts of neck, initial encounter: Secondary | ICD-10-CM | POA: Diagnosis not present

## 2017-03-13 DIAGNOSIS — I10 Essential (primary) hypertension: Secondary | ICD-10-CM | POA: Insufficient documentation

## 2017-03-13 DIAGNOSIS — W010XXA Fall on same level from slipping, tripping and stumbling without subsequent striking against object, initial encounter: Secondary | ICD-10-CM | POA: Insufficient documentation

## 2017-03-13 DIAGNOSIS — S161XXA Strain of muscle, fascia and tendon at neck level, initial encounter: Secondary | ICD-10-CM | POA: Diagnosis not present

## 2017-03-13 DIAGNOSIS — S098XXA Other specified injuries of head, initial encounter: Secondary | ICD-10-CM | POA: Diagnosis not present

## 2017-03-13 DIAGNOSIS — Y999 Unspecified external cause status: Secondary | ICD-10-CM | POA: Insufficient documentation

## 2017-03-13 DIAGNOSIS — Y9301 Activity, walking, marching and hiking: Secondary | ICD-10-CM | POA: Insufficient documentation

## 2017-03-13 DIAGNOSIS — S0990XA Unspecified injury of head, initial encounter: Secondary | ICD-10-CM

## 2017-03-13 DIAGNOSIS — S0181XA Laceration without foreign body of other part of head, initial encounter: Secondary | ICD-10-CM | POA: Insufficient documentation

## 2017-03-13 DIAGNOSIS — S199XXA Unspecified injury of neck, initial encounter: Secondary | ICD-10-CM | POA: Diagnosis not present

## 2017-03-13 DIAGNOSIS — S0282XA Fracture of other specified skull and facial bones, left side, initial encounter for closed fracture: Secondary | ICD-10-CM

## 2017-03-13 DIAGNOSIS — S63241A Subluxation of distal interphalangeal joint of left index finger, initial encounter: Secondary | ICD-10-CM | POA: Diagnosis not present

## 2017-03-13 DIAGNOSIS — W19XXXA Unspecified fall, initial encounter: Secondary | ICD-10-CM

## 2017-03-13 MED ORDER — AMOXICILLIN-POT CLAVULANATE 875-125 MG PO TABS: 1.0000 | ORAL_TABLET | Freq: Two times a day (BID) | ORAL | 0 refills | Status: DC

## 2017-03-13 MED ORDER — LIDOCAINE HCL (PF) 1 % IJ SOLN
5.0000 mL | Freq: Once | INTRAMUSCULAR | Status: AC
  Administered 2017-03-13: 5 mL
  Filled 2017-03-13: qty 5

## 2017-03-13 NOTE — ED Provider Notes (Signed)
Emergency Department Provider Note   I have reviewed the triage vital signs and the nursing notes.   HISTORY  Chief Complaint Fall   HPI Andrew [name] is a 83 y.o. male with PMH of HTN presents to the emergency pertinent for evaluation after mechanical fall. The patient states he was walking down the road when he suddenly felt like head was getting too far in front of his feet. He tried to speed up and catch himself but could not. He fell face first on the ground. No loss of consciousness. No confusion or vomiting since the incident. He reports pain in his face and nose with some bleeding from those areas. Denies any visual changes including double vision. No pain with extraocular movements. No neck discomfort. He denies any preceding chest pain, shortness of breath, palpitations, lightheadedness, vertigo. Reports last tetanus shot was within the last 5 years.    Past Medical History:  Diagnosis Date  . Hypertension     Patient Active Problem List   Diagnosis Date Noted  . Rectal bleeding 12/19/2016  . Anemia 12/19/2016  . Loss of weight 12/19/2016    Past Surgical History:  Procedure Laterality Date  . COLONOSCOPY N/A 01/19/2017   Procedure: COLONOSCOPY;  Surgeon: Daneil Dolin, MD;  Location: [facility];  Service: Endoscopy;  Laterality: N/A;  1:15pm  . ESOPHAGOGASTRODUODENOSCOPY N/A 01/19/2017   Procedure: ESOPHAGOGASTRODUODENOSCOPY (EGD);  Surgeon: Daneil Dolin, MD;  Location: [facility];  Service: Endoscopy;  Laterality: N/A;    Current Outpatient Rx  . Order #: [id number] Class: Historical Med  . Order #: [id number] Class: Historical Med  . Order #: [id number] Class: Historical Med  . Order #: [id number] Class: Historical Med  . Order #: [id number] Class: Historical Med  . Order #: [id number] Class: Normal  . Order #: [id number] Class: Print  . Order #: [id number] Class: Normal    Allergies Patient has no known allergies.  Family History  Problem Relation Age of  Onset  . Colon cancer Neg Hx   . Colon polyps Neg Hx     Social History Social History  Substance Use Topics  . Smoking status: Former Smoker    Quit date: 1980  . Smokeless tobacco: Never Used  . Alcohol use No    Review of Systems  Constitutional: No fever/chills Eyes: No visual changes. ENT: No sore throat. Bloody nose.  Cardiovascular: Denies chest pain. Respiratory: Denies shortness of breath. Gastrointestinal: No abdominal pain.  No nausea, no vomiting.  No diarrhea.  No constipation. Genitourinary: Negative for dysuria. Musculoskeletal: Negative for back pain. Skin: left eyebrow laceration and finger lacerations.  Neurological: Negative for focal weakness or numbness. Positive HA.   10-point ROS otherwise negative.  ____________________________________________   PHYSICAL EXAM:  VITAL SIGNS: ED Triage Vitals  Enc Vitals Group     BP 03/13/17 1554 (!) 176/74     Pulse Rate 03/13/17 1554 72     Resp 03/13/17 1554 17     Temp 03/13/17 1554 97.7 F (36.5 C)     Temp src --      SpO2 03/13/17 1554 100 %     Weight 03/13/17 1554 150 lb (68 kg)     Height 03/13/17 1554 5\' 11"  (1.803 m)     Pain Score 03/13/17 1549 0   Constitutional: Alert and oriented. Well appearing and in no acute distress. Eyes: Conjunctivae are normal. PERRL. EOMI. Bruising around left eye.  Head: Left eyebrow laceration 3 cm. No hematoma, lacerations, or abrasions visible on  the scalp.  Nose: Nasal bridge swelling with some bruising. No septal hematoma. Mild oozing blood from right nostril.  Mouth/Throat: Mucous membranes are moist.  Oropharynx non-erythematous. Bruising to the left upper lip with no laceration.  Neck: No stridor.  No cervical spine tenderness to palpation. Cardiovascular: Normal rate, regular rhythm. Good peripheral circulation. Grossly normal heart sounds.   Respiratory: Normal respiratory effort.  No retractions. Lungs CTAB. Gastrointestinal: Soft and nontender. No  distention.  Musculoskeletal: No lower extremity tenderness nor edema. No gross deformities of extremities. Full ROM of bilateral wrists, elbows, shoulders, and hips.  Neurologic:  Normal speech and language. No gross focal neurologic deficits are appreciated.  Skin:  Skin is warm and dry. Face lacerations noted above. Small avulsion type injuries to the tips of the right right and middle fingers. No nail involvement. No tendon involvement. Avulsion is superficial in both fingers.   ____________________________________________   LABS (all labs ordered are listed, but only abnormal results are displayed)  Labs Reviewed - No data to display ____________________________________________  EKG   EKG Interpretation  Date/Time:  Monday March 13 2017 15:52:30 EDT Ventricular Rate:  70 PR Interval:    QRS Duration: 88 QT Interval:  382 QTC Calculation: 413 R Axis:   77 Text Interpretation:  Sinus rhythm No STEMI.  Confirmed by Nanda Quinton [id number] on 03/13/2017 4:06:47 PM       ____________________________________________  RADIOLOGY  Ct Head Wo Contrast  Result Date: 03/13/2017 CLINICAL DATA:  Pt tripped and fell today while walking down his road; no loc; Pt denies pain. Laceration left forehead, nosebleed, left upper lip swelling, and laceration to right middle and ring fingers. EXAM: CT HEAD WITHOUT CONTRAST CT MAXILLOFACIAL WITHOUT CONTRAST CT CERVICAL SPINE WITHOUT CONTRAST TECHNIQUE: Multidetector CT imaging of the head, cervical spine, and maxillofacial structures were performed using the standard protocol without intravenous contrast. Multiplanar CT image reconstructions of the cervical spine and maxillofacial structures were also generated. COMPARISON:  None. FINDINGS: CT HEAD FINDINGS Brain: Diffuse parenchymal atrophy. Patchy areas of hypoattenuation in deep and periventricular white matter bilaterally. Negative for acute intracranial hemorrhage, mass lesion, acute infarction,  midline shift, or mass-effect. Acute infarct may be inapparent on noncontrast CT. Ventricles and sulci symmetric. Vascular: Atherosclerotic and physiologic intracranial calcifications. Skull: Normal. Negative for fracture or focal lesion. Other: Fluid level in the right maxillary sinus. Gas and debris in the left maxillary sinus. 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 See below. CT MAXILLOFACIAL FINDINGS Osseous: Left orbital floor fracture. Fracture of the lateral wall of the left orbit, minimally displaced. Zygomatic arches intact. Fractures of the anterior, medial and lateral walls of the left maxillary sinus. Minimally displaced fractures of the anterior and medial walls right maxillary sinus. Fracture of the nasal spine on the right. Bilateral nasal bone fractures. Mandible intact.  Patient is edentulous. Orbits: Left orbital emphysema. No evidence of significant herniation or entrapment in the orbital floor fracture. Sinuses: Fluid level in the right maxillary sinus. High attenuation material probably blood and gas in the left maxillary sinus. Frontal sinuses and ethmoid air cells unremarkable. Soft tissues: As above CT CERVICAL SPINE FINDINGS Alignment: Preserved Skull base and vertebrae: No acute fracture. No primary bone lesion or focal pathologic process. Soft tissues and spinal canal: No prevertebral fluid or swelling. No visible canal hematoma. Disc levels: There is fusion across all the interspaces C2- T3, as well as across posterior elements. Upper chest: Visualize lung apices clear. Bilateral carotid and vertebral  arterial calcifications. Other: None IMPRESSION: 1. Negative for bleed or other acute intracranial process. 2. Mild atrophy and nonspecific white matter changes. 3. Left orbital floor fracture without radiographic evidence of entrapment or significant herniation. 4. Multiple additional facial fractures as above. 5. No acute cervical fracture or  dislocation. 6. Fusion across multiple interspaces hand posterior elements C2-T3. Electronically Signed   By: Lucrezia Europe M.D.   On: 03/13/2017 17:30   Ct Cervical Spine Wo Contrast  Result Date: 03/13/2017 CLINICAL DATA:  Pt tripped and fell today while walking down his road; no loc; Pt denies pain. Laceration left forehead, nosebleed, left upper lip swelling, and laceration to right middle and ring fingers. EXAM: CT HEAD WITHOUT CONTRAST CT MAXILLOFACIAL WITHOUT CONTRAST CT CERVICAL SPINE WITHOUT CONTRAST TECHNIQUE: Multidetector CT imaging of the head, cervical spine, and maxillofacial structures were performed using the standard protocol without intravenous contrast. Multiplanar CT image reconstructions of the cervical spine and maxillofacial structures were also generated. COMPARISON:  None. FINDINGS: CT HEAD FINDINGS Brain: Diffuse parenchymal atrophy. Patchy areas of hypoattenuation in deep and periventricular white matter bilaterally. Negative for acute intracranial hemorrhage, mass lesion, acute infarction, midline shift, or mass-effect. Acute infarct may be inapparent on noncontrast CT. Ventricles and sulci symmetric. Vascular: Atherosclerotic and physiologic intracranial calcifications. Skull: Normal. Negative for fracture or focal lesion. Other: Fluid level in the right maxillary sinus. Gas and debris in the left maxillary sinus. 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 See below. CT MAXILLOFACIAL FINDINGS Osseous: Left orbital floor fracture. Fracture of the lateral wall of the left orbit, minimally displaced. Zygomatic arches intact. Fractures of the anterior, medial and lateral walls of the left maxillary sinus. Minimally displaced fractures of the anterior and medial walls right maxillary sinus. Fracture of the nasal spine on the right. Bilateral nasal bone fractures. Mandible intact.  Patient is edentulous. Orbits: Left orbital emphysema. No evidence of  significant herniation or entrapment in the orbital floor fracture. Sinuses: Fluid level in the right maxillary sinus. High attenuation material probably blood and gas in the left maxillary sinus. Frontal sinuses and ethmoid air cells unremarkable. Soft tissues: As above CT CERVICAL SPINE FINDINGS Alignment: Preserved Skull base and vertebrae: No acute fracture. No primary bone lesion or focal pathologic process. Soft tissues and spinal canal: No prevertebral fluid or swelling. No visible canal hematoma. Disc levels: There is fusion across all the interspaces C2- T3, as well as across posterior elements. Upper chest: Visualize lung apices clear. Bilateral carotid and vertebral arterial calcifications. Other: None IMPRESSION: 1. Negative for bleed or other acute intracranial process. 2. Mild atrophy and nonspecific white matter changes. 3. Left orbital floor fracture without radiographic evidence of entrapment or significant herniation. 4. Multiple additional facial fractures as above. 5. No acute cervical fracture or dislocation. 6. Fusion across multiple interspaces hand posterior elements C2-T3. Electronically Signed   By: Lucrezia Europe M.D.   On: 03/13/2017 17:30   Dg Hand Complete Right  Result Date: 03/13/2017 CLINICAL DATA:  Fall with pain to the middle finger EXAM: RIGHT HAND - COMPLETE 3+ VIEW COMPARISON:  None. FINDINGS: No acute displaced fracture is seen. Mild subluxation at the first PIP joint and first MCP joint, likely due to arthropathy. Degenerative changes at the second through fifth PIP joints. Marked arthritis at the first MCP joint and moderate arthritis at the first [facility] joint. IMPRESSION: 1. No definite acute osseous abnormality 2. 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 Electronically Signed   By: Donavan Foil M.D.   On: 03/13/2017 17:12   Ct Maxillofacial Wo Contrast  Result Date: 03/13/2017 CLINICAL DATA:  Pt tripped and fell today while  walking down his road; no loc; Pt denies pain. Laceration left forehead, nosebleed, left upper lip swelling, and laceration to right middle and ring fingers. EXAM: CT HEAD WITHOUT CONTRAST CT MAXILLOFACIAL WITHOUT CONTRAST CT CERVICAL SPINE WITHOUT CONTRAST TECHNIQUE: Multidetector CT imaging of the head, cervical spine, and maxillofacial structures were performed using the standard protocol without intravenous contrast. Multiplanar CT image reconstructions of the cervical spine and maxillofacial structures were also generated. COMPARISON:  None. FINDINGS: CT HEAD FINDINGS Brain: Diffuse parenchymal atrophy. Patchy areas of hypoattenuation in deep and periventricular white matter bilaterally. Negative for acute intracranial hemorrhage, mass lesion, acute infarction, midline shift, or mass-effect. Acute infarct may be inapparent on noncontrast CT. Ventricles and sulci symmetric. Vascular: Atherosclerotic and physiologic intracranial calcifications. Skull: Normal. Negative for fracture or focal lesion. Other: Fluid level in the right maxillary sinus. Gas and debris in the left maxillary sinus. 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 See below. CT MAXILLOFACIAL FINDINGS Osseous: Left orbital floor fracture. Fracture of the lateral wall of the left orbit, minimally displaced. Zygomatic arches intact. Fractures of the anterior, medial and lateral walls of the left maxillary sinus. Minimally displaced fractures of the anterior and medial walls right maxillary sinus. Fracture of the nasal spine on the right. Bilateral nasal bone fractures. Mandible intact.  Patient is edentulous. Orbits: Left orbital emphysema. No evidence of significant herniation or entrapment in the orbital floor fracture. Sinuses: Fluid level in the right maxillary sinus. High attenuation material probably blood and gas in the left maxillary sinus. Frontal sinuses and ethmoid air cells unremarkable. Soft tissues:  As above CT CERVICAL SPINE FINDINGS Alignment: Preserved Skull base and vertebrae: No acute fracture. No primary bone lesion or focal pathologic process. Soft tissues and spinal canal: No prevertebral fluid or swelling. No visible canal hematoma. Disc levels: There is fusion across all the interspaces C2- T3, as well as across posterior elements. Upper chest: Visualize lung apices clear. Bilateral carotid and vertebral arterial calcifications. Other: None IMPRESSION: 1. Negative for bleed or other acute intracranial process. 2. Mild atrophy and nonspecific white matter changes. 3. Left orbital floor fracture without radiographic evidence of entrapment or significant herniation. 4. Multiple additional facial fractures as above. 5. No acute cervical fracture or dislocation. 6. Fusion across multiple interspaces hand posterior elements C2-T3. Electronically Signed   By: Lucrezia Europe M.D.   On: 03/13/2017 17:30    ____________________________________________   PROCEDURES  Procedure(s) performed:   Marland KitchenMarland KitchenLaceration Repair Date/Time: 03/13/2017 6:12 PM Performed by: Jillana Selph, [facility] Authorized by: Margette Fast   Consent:    Consent obtained:  Verbal   Consent given by:  Patient   Risks discussed:  Infection, pain, retained foreign body, vascular damage, poor wound healing, poor cosmetic result, need for additional repair and nerve damage   Alternatives discussed:  No treatment Anesthesia (see MAR for exact dosages):    Anesthesia method:  Local infiltration   Local anesthetic:  Lidocaine 1% w/o epi Laceration details:    Location:  Face   Face location:  L eyebrow   Length (cm):  3 Repair type:    Repair type:  Simple Pre-procedure details:    Preparation:  Patient was prepped and draped in usual sterile fashion and imaging obtained to evaluate for  foreign bodies Exploration:    Wound exploration: wound explored through full range of motion and entire depth of wound probed and visualized      Wound extent: no foreign bodies/material noted, no nerve damage noted and no vascular damage noted     Contaminated: no   Treatment:    Area cleansed with:  Betadine and saline   Amount of cleaning:  Standard   Irrigation solution:  Sterile saline Skin repair:    Repair method:  Sutures   Suture size:  5-0   Wound skin closure material used: Vicryl.   Suture technique:  Simple interrupted   Number of sutures:  3 Approximation:    Approximation:  Close   Vermilion border: well-aligned   Post-procedure details:    Dressing:  Open (no dressing)   Patient tolerance of procedure:  Tolerated well, no immediate complications     ____________________________________________   INITIAL IMPRESSION / ASSESSMENT AND PLAN / ED COURSE  Pertinent labs & imaging results that were available during my care of the patient were reviewed by me and considered in my medical decision making (see chart for details).  Patient presents to the emergency department for evaluation of mechanical fall. He has a small left eyebrow laceration, bleeding from the nose with some deformity there. He has very superficial abrasions/fingertip avulsion injuries to the right ring and middle fingers. No nail involvement. No visible bone. To have removed only just up to the dermal layer of skin. Plan for debridement and dressing of these areas after x-ray. 3 cm left eyebrow laceration to be repaired after CT. Obtaining CT of the head, face, and neck. Tetanus up to date.   06:13 PM Multiple facial fracture noted above. No ICH or skull fracture. Paged ENT for discussion regarding f/u timeframe and +/- abx. Laceration repaired.   07:40 PM Spoke with Dr. Mancel Parsons. Recommends nose blowing precautions, Augmentin, and follow up in office in the coming days. Also recommends removing upper denture and adhering to soft diet until seen in office.   At this time, I do not feel there is any life-threatening condition present. I have  reviewed and discussed all results (EKG, imaging, lab, urine as appropriate), exam findings with patient. I have reviewed nursing notes and appropriate previous records.  I feel the patient is safe to be discharged home without further emergent workup. Discussed usual and customary return precautions. Patient and family (if present) verbalize understanding and are comfortable with this plan.  Patient will follow-up with their primary care provider. If they do not have a primary care provider, information for follow-up has been provided to them. All questions have been answered.  ____________________________________________  FINAL CLINICAL IMPRESSION(S) / ED DIAGNOSES  Final diagnoses:  Fall, initial encounter  Injury of head, initial encounter  Closed fracture of left orbital floor, initial encounter ([place])  Closed fracture of nasal bone, initial encounter  Closed fracture of other bone of left side of face, initial encounter ([place])  Strain of neck muscle, initial encounter     MEDICATIONS GIVEN DURING THIS VISIT:  Medications  lidocaine (PF) (XYLOCAINE) 1 % injection 5 mL (5 mLs Infiltration Given 03/13/17 1825)     NEW OUTPATIENT MEDICATIONS STARTED DURING THIS VISIT:  New Prescriptions   AMOXICILLIN-CLAVULANATE (AUGMENTIN) 875-125 MG TABLET    Take 1 tablet by mouth every 12 (twelve) hours.      Note:  This document was prepared using Dragon voice recognition software and may include unintentional dictation errors.  Nanda Quinton,  MD Emergency Medicine    Adon Gehlhausen, [facility], MD 03/13/17 1949

## 2017-03-13 NOTE — ED Triage Notes (Signed)
Pt reports he tripped and fell while walking down his road today. Denies loc. Pt hypertensive per EMS. Pt denies pain. Laceration left forehead, nosebleed, left upper lip swelling, and laceration to right middle and ring fingers.

## 2017-03-13 NOTE — Discharge Instructions (Signed)
You sere seen in the ED today with multiple facial fractures after a fall. We repaired your lacerations and will have you follow up with Dr. Mancel Parsons in the office. Call tomorrow morning. Start taking the antibiotic as prescribed.   Do not blow your nose and be sure to open your mouth if you need to sneeze. You can moisten the nose with saline spray. Also, remove your top dentures and do not eat anything hard until seen in the office by Dr. Mancel Parsons.

## 2017-03-20 ENCOUNTER — Ambulatory Visit: Payer: PPO | Admitting: Nurse Practitioner

## 2017-03-20 DIAGNOSIS — Z6821 Body mass index (BMI) 21.0-21.9, adult: Secondary | ICD-10-CM | POA: Diagnosis not present

## 2017-03-20 DIAGNOSIS — I10 Essential (primary) hypertension: Secondary | ICD-10-CM | POA: Diagnosis not present

## 2017-03-20 DIAGNOSIS — R5383 Other fatigue: Secondary | ICD-10-CM | POA: Diagnosis not present

## 2017-03-20 DIAGNOSIS — M545 Low back pain: Secondary | ICD-10-CM | POA: Diagnosis not present

## 2017-03-20 DIAGNOSIS — R55 Syncope and collapse: Secondary | ICD-10-CM | POA: Diagnosis not present

## 2017-03-20 DIAGNOSIS — S022XXA Fracture of nasal bones, initial encounter for closed fracture: Secondary | ICD-10-CM | POA: Diagnosis not present

## 2017-03-27 ENCOUNTER — Encounter: Payer: Self-pay | Admitting: Cardiovascular Disease

## 2017-03-30 ENCOUNTER — Encounter: Payer: Self-pay | Admitting: Cardiovascular Disease

## 2017-03-30 ENCOUNTER — Encounter: Payer: Self-pay | Admitting: *Deleted

## 2017-03-30 ENCOUNTER — Ambulatory Visit (INDEPENDENT_AMBULATORY_CARE_PROVIDER_SITE_OTHER): Payer: PPO | Admitting: Cardiovascular Disease

## 2017-03-30 VITALS — BP 160/67 | HR 58 | Ht 71.0 in | Wt 149.0 lb

## 2017-03-30 DIAGNOSIS — R0989 Other specified symptoms and signs involving the circulatory and respiratory systems: Secondary | ICD-10-CM

## 2017-03-30 DIAGNOSIS — I10 Essential (primary) hypertension: Secondary | ICD-10-CM

## 2017-03-30 DIAGNOSIS — R55 Syncope and collapse: Secondary | ICD-10-CM

## 2017-03-30 DIAGNOSIS — R001 Bradycardia, unspecified: Secondary | ICD-10-CM | POA: Diagnosis not present

## 2017-03-30 DIAGNOSIS — R5383 Other fatigue: Secondary | ICD-10-CM

## 2017-03-30 MED ORDER — LOSARTAN POTASSIUM 100 MG PO TABS: 50.0000 mg | ORAL_TABLET | Freq: Every day | ORAL | Status: DC

## 2017-03-30 MED ORDER — LOSARTAN POTASSIUM 25 MG PO TABS: 25.0000 mg | ORAL_TABLET | Freq: Every day | ORAL | 3 refills | Status: DC

## 2017-03-30 NOTE — Progress Notes (Addendum)
CARDIOLOGY CONSULT NOTE  Patient ID: Andrew [name] MRN: [id number] DOB/AGE: [birth year] 83 y.o.  Admit date: (Not on file) Primary Physician: Manon Hilding, MD Referring Physician: Quintin Alto  Reason for Consultation: Syncope  HPI: Andrew [name] is a 83 y.o. male who is being seen today for the evaluation of syncope at the request of Quintin Alto, Silvestre Moment, MD.   He was evaluated in the ED on 03/13/17 for a mechanical fall. He had been walking down the road when he suddenly felt like his head was getting too far in front of his feet. He try to speed up and catch himself but was unable to and fell face first on the ground. He did not lose consciousness. There was no antecedent chest pain, palpitations, or shortness of breath.  Head CT was negative for bleed or other acute intracranial process. There was a left orbital floor fracture.  ECG which I personally interpreted demonstrated sinus rhythm with no ischemic abnormalities, nor any arrhythmias.  Labs in June 2018: BUN 20, creatinine 0.94, sodium 140, potassium 4.5, normal liver transaminases, total cholesterol 143, triglycerides 161, HDL 35, LDL 76. Hemoglobin 12, platelets 174.  He is here with his wife. It appears that the patient does not remember if he lost consciousness. He denies exertional chest pain, shortness of breath, palpitations, lightheadedness, and dizziness. There is no history of prior MI. He does complain of progressive fatigue over the past several months.  He has hypertension and had been on losartan 100 mg but it was recently cut back to 50 mg as systolic blood pressure was reportedly normal at his PCPs office. He also takes metoprolol 50 mg daily. Heart rate is 58 bpm in our office.   No Known Allergies  Current Outpatient Prescriptions  Medication Sig Dispense Refill  . losartan (COZAAR) 100 MG tablet Take 100 mg by mouth at bedtime.     . metoprolol (LOPRESSOR) 50 MG tablet Take 50 mgs by mouth once daily    .  Omega-3 Fatty Acids (FISH OIL) 1000 MG CAPS Take 1,000 mg by mouth 3 (three) times daily.     . pantoprazole (PROTONIX) 40 MG tablet Take 40 mg by mouth daily.     No current facility-administered medications for this visit.     Past Medical History:  Diagnosis Date  . Hypertension     Past Surgical History:  Procedure Laterality Date  . COLONOSCOPY N/A 01/19/2017   Procedure: COLONOSCOPY;  Surgeon: Daneil Dolin, MD;  Location: [facility];  Service: Endoscopy;  Laterality: N/A;  1:15pm  . ESOPHAGOGASTRODUODENOSCOPY N/A 01/19/2017   Procedure: ESOPHAGOGASTRODUODENOSCOPY (EGD);  Surgeon: Daneil Dolin, MD;  Location: [facility];  Service: Endoscopy;  Laterality: N/A;    Social History   Social History  . Marital status: Married    Spouse name: N/A  . Number of children: N/A  . Years of education: N/A   Occupational History  . Not on file.   Social History Main Topics  . Smoking status: Former Smoker    Quit date: 1980  . Smokeless tobacco: Never Used  . Alcohol use No  . Drug use: No  . Sexual activity: No   Other Topics Concern  . Not on file   Social History Narrative  . No narrative on file     No family history of premature CAD in 1st degree relatives.  Current Meds  Medication Sig  . losartan (COZAAR) 100 MG tablet  Take 100 mg by mouth at bedtime.   . metoprolol (LOPRESSOR) 50 MG tablet Take 50 mgs by mouth once daily  . Omega-3 Fatty Acids (FISH OIL) 1000 MG CAPS Take 1,000 mg by mouth 3 (three) times daily.   . pantoprazole (PROTONIX) 40 MG tablet Take 40 mg by mouth daily.      Review of systems complete and found to be negative unless listed above in HPI    Physical exam Blood pressure (!) 160/67, pulse (!) 58, height 5\' 11"  (1.803 m), weight 149 lb (67.6 kg), SpO2 98 %. General: NAD Neck: No JVD, no thyromegaly or thyroid nodule.  Lungs: Clear to auscultation bilaterally with normal respiratory effort. CV: Nondisplaced PMI. Regular  rate and rhythm, normal S1/S2, no S3/S4, no murmur.  No peripheral edema.  Right carotid bruit.    Abdomen: Soft, nontender, no distention.  Skin: Intact without lesions or rashes.  Neurologic: Alert and oriented x 3.  Psych: Normal affect. Extremities: No clubbing or cyanosis.  HEENT: Normal.   ECG: Most recent ECG reviewed.   Labs: No results found for: K, BUN, CREATININE, ALT, TSH, HGB   Lipids: No results found for: LDLCALC, LDLDIRECT, CHOL, TRIG, HDL      ASSESSMENT AND PLAN:  1. Syncope: Unclear if this is related to a brady-arrhythmia or ischemic etiology. I will proceed with a nuclear myocardial perfusion imaging study to evaluate for ischemic heart disease (Lexiscan Myoview). I will also obtain a 30 day event monitor. I will have him cut back metoprolol to 25 mg daily for 3 days and then discontinue it altogether.  2. Hypertension: Elevated. As I am discontinuing metoprolol, I will increase losartan to 75 mg daily.  3. Fatigue: Given episode of syncope and progressive fatigue, I will obtain a Lexiscan Myoview stress test to evaluate for ischemic heart disease.  4. Bradycardia: I am discontinuing metoprolol.  5. Carotid bruit on right: I will obtain Dopplers.    Disposition: Follow up in 6-8 weeks.  Signed: Kate Sable, M.D., F.A.C.C.  03/30/2017, 11:05 AM

## 2017-03-30 NOTE — Addendum Note (Signed)
Addended by: Laurine Blazer on: 03/30/2017 11:45 AM   Modules accepted: Orders

## 2017-03-30 NOTE — Patient Instructions (Addendum)
Medication Instructions:   Decrease Lopressor to 25mg  daily x 3 days, then STOP.    Increase Losartan to 75mg  daily.   Labwork: none  Testing/Procedures:  Your physician has requested that you have a lexiscan myoview. For further information please visit [email or web address]. Please follow instruction sheet, as given. Your physician has requested that you have a carotid duplex. This test is an ultrasound of the carotid arteries in your neck. It looks at blood flow through these arteries that supply the brain with blood. Allow one hour for this exam. There are no restrictions or special instructions.  Office will contact with results via phone or letter.    Follow-Up: 6-8 weeks   Any Other Special Instructions Will Be Listed Below (If Applicable).  If you need a refill on your cardiac medications before your next appointment, please call your pharmacy.

## 2017-03-30 NOTE — Addendum Note (Signed)
Addended by: Laurine Blazer on: 03/30/2017 11:24 AM   Modules accepted: Orders

## 2017-04-06 ENCOUNTER — Ambulatory Visit: Payer: PPO

## 2017-04-06 ENCOUNTER — Encounter: Payer: Self-pay | Admitting: Radiology

## 2017-04-06 DIAGNOSIS — R55 Syncope and collapse: Secondary | ICD-10-CM | POA: Diagnosis not present

## 2017-04-06 DIAGNOSIS — I6523 Occlusion and stenosis of bilateral carotid arteries: Secondary | ICD-10-CM | POA: Diagnosis not present

## 2017-04-06 DIAGNOSIS — R0989 Other specified symptoms and signs involving the circulatory and respiratory systems: Secondary | ICD-10-CM | POA: Diagnosis not present

## 2017-04-06 NOTE — Progress Notes (Signed)
Patient had critical finding on carotid Duplex study done today. Report sent to Dr Domenic Polite.

## 2017-04-06 NOTE — Progress Notes (Signed)
Sorry, this is a Dr. Bronson Ing patient.  Will forward note to him.

## 2017-04-07 ENCOUNTER — Telehealth: Payer: Self-pay

## 2017-04-07 ENCOUNTER — Encounter (HOSPITAL_COMMUNITY): Payer: Self-pay

## 2017-04-07 ENCOUNTER — Encounter (HOSPITAL_COMMUNITY)
Admission: RE | Admit: 2017-04-07 | Discharge: 2017-04-07 | Disposition: A | Discharge status: Home / Self Care | Payer: PPO | Source: Ambulatory Visit | Attending: Cardiovascular Disease | Admitting: Cardiovascular Disease

## 2017-04-07 ENCOUNTER — Ambulatory Visit (HOSPITAL_COMMUNITY)
Admission: RE | Admit: 2017-04-07 | Discharge: 2017-04-07 | Disposition: A | Discharge status: Home / Self Care | Payer: PPO | Source: Ambulatory Visit | Attending: Cardiovascular Disease | Admitting: Cardiovascular Disease

## 2017-04-07 ENCOUNTER — Other Ambulatory Visit: Payer: Self-pay

## 2017-04-07 DIAGNOSIS — I350 Nonrheumatic aortic (valve) stenosis: Secondary | ICD-10-CM

## 2017-04-07 DIAGNOSIS — R55 Syncope and collapse: Secondary | ICD-10-CM | POA: Diagnosis not present

## 2017-04-07 DIAGNOSIS — R5383 Other fatigue: Secondary | ICD-10-CM | POA: Diagnosis not present

## 2017-04-07 LAB — VAS US CAROTID
LEFT ECA DIAS: -11 cm/s
LEFT VERTEBRAL DIAS: -24 cm/s
Left CCA dist dias: -14 cm/s
Left CCA dist sys: -86 cm/s
Left CCA prox dias: 1 cm/s
Left CCA prox sys: 146 cm/s
Left ICA dist dias: -12 cm/s
Left ICA dist sys: -155 cm/s
Left ICA prox dias: -180 cm/s
Left ICA prox sys: -566 cm/s
RIGHT ECA DIAS: 0 cm/s
RIGHT VERTEBRAL DIAS: -17 cm/s
Right CCA prox dias: 10 cm/s
Right CCA prox sys: 125 cm/s
Right cca dist sys: -151 cm/s

## 2017-04-07 LAB — NM MYOCAR MULTI W/SPECT W/WALL MOTION / EF
LV dias vol: 56 mL (ref 62–150)
LV sys vol: 27 mL
Peak HR: 101 {beats}/min
RATE: 0.21
Rest HR: 55 {beats}/min
SDS: 0
SRS: 2
SSS: 2
TID: 0.96

## 2017-04-07 MED ORDER — TECHNETIUM TC 99M TETROFOSMIN IV KIT
10.0000 | PACK | Freq: Once | INTRAVENOUS | Status: AC | PRN
  Administered 2017-04-07: 9.9 via INTRAVENOUS

## 2017-04-07 MED ORDER — REGADENOSON 0.4 MG/5ML IV SOLN
INTRAVENOUS | Status: AC
  Administered 2017-04-07: 0.4 mg via INTRAVENOUS
  Filled 2017-04-07: qty 5

## 2017-04-07 MED ORDER — SODIUM CHLORIDE 0.9% FLUSH
INTRAVENOUS | Status: AC
  Administered 2017-04-07: 10 mL via INTRAVENOUS
  Filled 2017-04-07: qty 10

## 2017-04-07 MED ORDER — TECHNETIUM TC 99M TETROFOSMIN IV KIT
30.0000 | PACK | Freq: Once | INTRAVENOUS | Status: AC | PRN
  Administered 2017-04-07: 30 via INTRAVENOUS

## 2017-04-07 NOTE — Telephone Encounter (Signed)
-----   Message from Herminio Commons, MD sent at 04/07/2017 12:37 PM EDT ----- Already resulted. Needs vascular surgery referral.

## 2017-04-07 NOTE — Telephone Encounter (Signed)
Patient notified. Referral placed. Routed to PCP.  

## 2017-04-07 NOTE — Telephone Encounter (Signed)
-----   Message from Herminio Commons, MD sent at 04/07/2017  1:11 PM EDT ----- Low risk study.

## 2017-04-07 NOTE — Telephone Encounter (Signed)
Patient notified. Routed to PCP 

## 2017-04-07 NOTE — Progress Notes (Signed)
I reviewed preliminary results which demonstrate high grade LICA stenosis. Please make referral to vascular surgery.

## 2017-04-07 NOTE — Progress Notes (Signed)
Order placed

## 2017-04-07 NOTE — Progress Notes (Signed)
I cannot locate the results.

## 2017-04-10 ENCOUNTER — Other Ambulatory Visit: Payer: Self-pay

## 2017-04-10 DIAGNOSIS — I6522 Occlusion and stenosis of left carotid artery: Secondary | ICD-10-CM

## 2017-04-13 ENCOUNTER — Encounter: Payer: Self-pay | Admitting: Vascular Surgery

## 2017-04-14 ENCOUNTER — Encounter (HOSPITAL_COMMUNITY): Payer: Self-pay | Admitting: *Deleted

## 2017-04-14 ENCOUNTER — Ambulatory Visit (INDEPENDENT_AMBULATORY_CARE_PROVIDER_SITE_OTHER)
Admission: RE | Admit: 2017-04-14 | Discharge: 2017-04-14 | Disposition: A | Discharge status: Home / Self Care | Payer: PPO | Source: Ambulatory Visit | Attending: Vascular Surgery | Admitting: Vascular Surgery

## 2017-04-14 ENCOUNTER — Ambulatory Visit (INDEPENDENT_AMBULATORY_CARE_PROVIDER_SITE_OTHER): Payer: PPO | Admitting: Vascular Surgery

## 2017-04-14 ENCOUNTER — Encounter: Payer: Self-pay | Admitting: Vascular Surgery

## 2017-04-14 ENCOUNTER — Other Ambulatory Visit: Payer: Self-pay

## 2017-04-14 VITALS — BP 160/66 | HR 65 | Ht 71.0 in | Wt 149.2 lb

## 2017-04-14 DIAGNOSIS — M1991 Primary osteoarthritis, unspecified site: Secondary | ICD-10-CM | POA: Diagnosis present

## 2017-04-14 DIAGNOSIS — I10 Essential (primary) hypertension: Secondary | ICD-10-CM | POA: Diagnosis present

## 2017-04-14 DIAGNOSIS — I6522 Occlusion and stenosis of left carotid artery: Secondary | ICD-10-CM | POA: Diagnosis not present

## 2017-04-14 DIAGNOSIS — I739 Peripheral vascular disease, unspecified: Principal | ICD-10-CM

## 2017-04-14 DIAGNOSIS — K625 Hemorrhage of anus and rectum: Secondary | ICD-10-CM | POA: Diagnosis not present

## 2017-04-14 DIAGNOSIS — I779 Disorder of arteries and arterioles, unspecified: Secondary | ICD-10-CM | POA: Diagnosis not present

## 2017-04-14 DIAGNOSIS — Z79899 Other long term (current) drug therapy: Secondary | ICD-10-CM | POA: Diagnosis not present

## 2017-04-14 DIAGNOSIS — Z87891 Personal history of nicotine dependence: Secondary | ICD-10-CM | POA: Diagnosis not present

## 2017-04-14 DIAGNOSIS — R634 Abnormal weight loss: Secondary | ICD-10-CM | POA: Diagnosis not present

## 2017-04-14 DIAGNOSIS — I6521 Occlusion and stenosis of right carotid artery: Secondary | ICD-10-CM | POA: Diagnosis present

## 2017-04-14 DIAGNOSIS — D649 Anemia, unspecified: Secondary | ICD-10-CM | POA: Diagnosis not present

## 2017-04-14 LAB — VAS US CAROTID
LEFT VERTEBRAL DIAS: -13 cm/s
Left ICA dist dias: -19 cm/s
Left ICA dist sys: -59 cm/s
Left ICA prox dias: -160 cm/s
Left ICA prox sys: -495 cm/s
RIGHT CCA MID DIAS: -9 cm/s
RIGHT ECA DIAS: -6 cm/s
Right CCA prox dias: 11 cm/s
Right CCA prox sys: 92 cm/s

## 2017-04-14 NOTE — Progress Notes (Addendum)
New Carotid Patient  Requested by:  Sasser, Silvestre Moment, MD [place], [place] [address]  Reason for consultation: left carotid stenosis   History of Present Illness   Andrew [name] is a 83 y.o. (1930-10-11) male who presents with chief complaint: blocked artery in neck.    This patient recently fell which lead to cardiac work-up and B carotid duplex.  Previous carotid studies demonstrated: RICA 7-41% stenosis, LICA 28-78% stenosis.  Patient has nohistory of TIA or stroke symptom.  The patient has never had amaurosis fugax or monocular blindness.  The patient has never had facial drooping or hemiplegia.  The patient has never had receptive or expressive aphasia.     Pt's recent stress testing was normal by report.  The patient's risks factors for carotid disease include: HTN, former smoking.   Past Medical History:  Diagnosis Date  . Hypertension     Past Surgical History:  Procedure Laterality Date  . COLONOSCOPY N/A 01/19/2017   Procedure: COLONOSCOPY;  Surgeon: Daneil Dolin, MD;  [facility]: [facility];  Service: Endoscopy;  Laterality: N/A;  1:15pm  . ESOPHAGOGASTRODUODENOSCOPY N/A 01/19/2017   Procedure: ESOPHAGOGASTRODUODENOSCOPY (EGD);  Surgeon: Daneil Dolin, MD;  Location: [facility];  Service: Endoscopy;  Laterality: N/A;    Social History   Social History  . Marital status: Married    Spouse name: N/A  . Number of children: N/A  . Years of education: N/A   Occupational History  . Not on file.   Social History Main Topics  . Smoking status: Former Smoker    Quit date: 1980  . Smokeless tobacco: Never Used  . Alcohol use No  . Drug use: No  . Sexual activity: No   Other Topics Concern  . Not on file   Social History Narrative  . No narrative on file    Family History  Problem Relation Age of Onset  . Colon cancer Neg Hx   . Colon polyps Neg Hx     Current Outpatient Prescriptions  Medication Sig Dispense Refill  .  HYDROcodone-acetaminophen (NORCO) 7.5-325 MG tablet Take 1 tablet by mouth every 6 (six) hours as needed for moderate pain.    Marland Kitchen losartan (COZAAR) 100 MG tablet Take 0.5 tablets (50 mg total) by mouth daily.    Marland Kitchen losartan (COZAAR) 25 MG tablet Take 1 tablet (25 mg total) by mouth daily. 90 tablet 3  . Omega-3 Fatty Acids (FISH OIL) 1000 MG CAPS Take 1,000 mg by mouth 3 (three) times daily.      No current facility-administered medications for this visit.     No Known Allergies  REVIEW OF SYSTEMS (negative unless checked):   Cardiac:  []  Chest pain or chest pressure? []  Shortness of breath upon activity? []  Shortness of breath when lying flat? []  Irregular heart rhythm?  Vascular:  []  Pain in calf, thigh, or hip brought on by walking? []  Pain in feet at night that wakes you up from your sleep? []  Blood clot in your veins? []  Leg swelling?  Pulmonary:  []  Oxygen at home? []  Productive cough? []  Wheezing?  Neurologic:  []  Sudden weakness in arms or legs? []  Sudden numbness in arms or legs? []  Sudden onset of difficult speaking or slurred speech? []  Temporary loss of vision in one eye? []  Problems with dizziness?  Gastrointestinal:  []  Blood in stool? []  Vomited blood?  Genitourinary:  []  Burning when urinating? []  Blood in urine?  Psychiatric:  []   Major depression  Hematologic:  []  Bleeding problems? []  Problems with blood clotting?  Dermatologic:  []  Rashes or ulcers?  Constitutional:  []  Fever or chills?  Ear/Nose/Throat:  []  Change in hearing? []  Nose bleeds? []  Sore throat?  Musculoskeletal:  []  Back pain? []  Joint pain? []  Muscle pain?   For VQI Use Only   PRE-ADM LIVING Home  AMB STATUS Ambulatory  CAD Sx None  PRIOR CHF None  STRESS TEST Normal    Physical Examination     Vitals:   04/14/17 0952  BP: (!) 160/66  Pulse: 65  Weight: 149 lb 3.2 oz (67.7 kg)  Height: 5\' 11"  (1.803 m)   Body mass index is 20.81 kg/m.  General  Alert, O x 3, WD, NAD  Head [place]/AT,    Ear/Nose/ Throat Hearing grossly intact, nares without erythema or drainage, oropharynx with Erythema without Exudate, Mallampati score: 3,   Eyes PERRLA, EOMI,    Neck Supple, mid-line trachea,    Pulmonary Sym exp, good B air movt, CTA B  Cardiac RRR, Nl S1, S2, no Murmurs, No rubs, No S3,S4  Vascular Vessel Right Left  Radial Palpable Palpable  Brachial Palpable Palpable  Carotid Palpable, No Bruit Palpable, No Bruit  Aorta Not palpable N/A  Femoral Palpable Palpable  Popliteal Not palpable Not palpable  PT Palpable Palpable  DP Palpable Palpable    Gastro- intestinal soft, non-distended, non-tender to palpation, No guarding or rebound, no HSM, no masses, no CVAT B, No palpable prominent aortic pulse,    Musculo- skeletal M/S 5/5 throughout  , Extremities without ischemic changes  , No edema present, No visible varicosities , No Lipodermatosclerosis present  Neurologic Cranial nerves 2-12 intact , Pain and light touch intact in extremities , Motor exam as listed above  Psychiatric Judgement intact, Mood & affect appropriate for pt's clinical situation  Dermatologic See M/S exam for extremity exam, No rashes otherwise noted  Lymphatic  Palpable lymph nodes: None     Non-invasive Vascular Imaging   Outside B carotid duplex (04/06/17)  R: 1-39%  L: 80-99% (566/180 c/s)  Nuclear Stress Test (04/07/17)  There was no ST segment deviation noted during stress.  Defect 1: There is a medium defect of moderate severity present in the basal inferoseptal, basal inferior and mid inferior location. This appears to be due to soft tissue attenuation artifact.  This is a low risk study. No ischemic territories.  Nuclear stress EF: 52%.       Repeat L Carotid Duplex (04/14/2017):  L ICA stenosis:  80-99% (495/160 c/s, normal position) L VA: patent and antegrade   Outside Studies/Documentation   3 pages of outside documents were reviewed  including: outside PCP charts.   Medical Decision Making   Andrew [name] is a 83 y.o. male who presents with: asx L ICA stenosis likely >90%, asx R ICA stenosis 1-39%.   Based on the patient's vascular studies and examination, I have offered the patient: L CEA. I discussed with the patient the risks, benefits, and alternatives to carotid endarterectomy.   The patient is aware that the risks of carotid endarterectomy include but are not limited to: bleeding, infection, stroke, myocardial infarction, death, cranial nerve injuries both temporary and permanent, neck hematoma, possible airway compromise, labile blood pressure post-operatively, cerebral hyperperfusion syndrome, and possible need for additional interventions in the future.  The patient is aware of the risks and agrees to proceed forward with the procedure.  I have scheduled him urgently for  this coming Monday, 27 AUG 18. I discussed in depth with the patient the nature of atherosclerosis, and emphasized the importance of maximal medical management including strict control of blood pressure, blood glucose, and lipid levels, obtaining regular exercise, antiplatelet agents, and cessation of smoking.   The patient is currently not on on statin as not medically indicated.  The patient is currently on an anti-platelet: ASA.  The patient is aware that without maximal medical management the underlying atherosclerotic disease process will progress, limiting the benefit of any interventions.  Thank you for allowing [place] to participate in this patient's care.   Adele Barthel, MD, FACS [facility] Office: [phone] Pager: [phone]  04/14/2017, 12:56 PM

## 2017-04-17 ENCOUNTER — Inpatient Hospital Stay (HOSPITAL_COMMUNITY)
Admission: RE | Admit: 2017-04-17 | Discharge: 2017-04-18 | DRG: 039 | Disposition: A | Discharge status: Home / Self Care | Payer: PPO | Source: Ambulatory Visit | Attending: Vascular Surgery | Admitting: Vascular Surgery

## 2017-04-17 ENCOUNTER — Inpatient Hospital Stay (HOSPITAL_COMMUNITY): Payer: PPO | Admitting: Certified Registered Nurse Anesthetist

## 2017-04-17 ENCOUNTER — Encounter (HOSPITAL_COMMUNITY)
Admission: RE | Disposition: A | Discharge status: Home / Self Care | Payer: Self-pay | Source: Ambulatory Visit | Attending: Vascular Surgery

## 2017-04-17 ENCOUNTER — Encounter (HOSPITAL_COMMUNITY): Payer: Self-pay | Admitting: *Deleted

## 2017-04-17 DIAGNOSIS — Z87891 Personal history of nicotine dependence: Secondary | ICD-10-CM

## 2017-04-17 DIAGNOSIS — I10 Essential (primary) hypertension: Secondary | ICD-10-CM | POA: Diagnosis present

## 2017-04-17 DIAGNOSIS — I779 Disorder of arteries and arterioles, unspecified: Secondary | ICD-10-CM | POA: Diagnosis present

## 2017-04-17 DIAGNOSIS — I6521 Occlusion and stenosis of right carotid artery: Secondary | ICD-10-CM | POA: Diagnosis present

## 2017-04-17 DIAGNOSIS — M1991 Primary osteoarthritis, unspecified site: Secondary | ICD-10-CM | POA: Diagnosis present

## 2017-04-17 DIAGNOSIS — Z79899 Other long term (current) drug therapy: Secondary | ICD-10-CM | POA: Diagnosis not present

## 2017-04-17 DIAGNOSIS — I6522 Occlusion and stenosis of left carotid artery: Secondary | ICD-10-CM | POA: Diagnosis not present

## 2017-04-17 DIAGNOSIS — I739 Peripheral vascular disease, unspecified: Secondary | ICD-10-CM | POA: Diagnosis present

## 2017-04-17 HISTORY — DX: Occlusion and stenosis of unspecified carotid artery: I65.29

## 2017-04-17 HISTORY — DX: Benign prostatic hyperplasia without lower urinary tract symptoms: N40.0

## 2017-04-17 HISTORY — PX: PATCH ANGIOPLASTY: SHX6230

## 2017-04-17 HISTORY — PX: ENDARTERECTOMY: SHX5162

## 2017-04-17 HISTORY — PX: CAROTID ENDARTERECTOMY: SUR193

## 2017-04-17 HISTORY — DX: Unspecified osteoarthritis, unspecified site: M19.90

## 2017-04-17 LAB — COMPREHENSIVE METABOLIC PANEL
ALT: 17 U/L (ref 17–63)
AST: 23 U/L (ref 15–41)
Albumin: 4.1 g/dL (ref 3.5–5.0)
Alkaline Phosphatase: 73 U/L (ref 38–126)
Anion gap: 8 (ref 5–15)
BUN: 22 mg/dL — ABNORMAL HIGH (ref 6–20)
CO2: 24 mmol/L (ref 22–32)
Calcium: 9.5 mg/dL (ref 8.9–10.3)
Chloride: 104 mmol/L (ref 101–111)
Creatinine, Ser: 0.84 mg/dL (ref 0.61–1.24)
GFR calc Af Amer: >60 mL/min (ref >60)
GFR calc non Af Amer: >60 mL/min (ref >60)
Glucose, Bld: 121 mg/dL — ABNORMAL HIGH (ref 65–99)
Potassium: 4.5 mmol/L (ref 3.5–5.1)
Sodium: 136 mmol/L (ref 135–145)
Total Bilirubin: 0.7 mg/dL (ref 0.3–1.2)
Total Protein: 7.6 g/dL (ref 6.5–8.1)

## 2017-04-17 LAB — CBC
HCT: 38.1 % — ABNORMAL LOW (ref 39.0–52.0)
HCT: 28.3 % — ABNORMAL LOW (ref 39.0–52.0)
Hemoglobin: 12.8 g/dL — ABNORMAL LOW (ref 13.0–17.0)
Hemoglobin: 9.4 g/dL — ABNORMAL LOW (ref 13.0–17.0)
MCH: 30.8 pg (ref 26.0–34.0)
MCH: 30.4 pg (ref 26.0–34.0)
MCHC: 33.6 g/dL (ref 30.0–36.0)
MCHC: 33.2 g/dL (ref 30.0–36.0)
MCV: 91.6 fL (ref 78.0–100.0)
MCV: 91.6 fL (ref 78.0–100.0)
Platelets: 231 10*3/uL (ref 150–400)
Platelets: 170 10*3/uL (ref 150–400)
RBC: 4.16 MIL/uL — ABNORMAL LOW (ref 4.22–5.81)
RBC: 3.09 MIL/uL — ABNORMAL LOW (ref 4.22–5.81)
RDW: 13.1 % (ref 11.5–15.5)
RDW: 13.1 % (ref 11.5–15.5)
WBC: 8.2 10*3/uL (ref 4.0–10.5)
WBC: 13 10*3/uL — ABNORMAL HIGH (ref 4.0–10.5)

## 2017-04-17 LAB — TYPE AND SCREEN
ABO/RH(D): A POS
Antibody Screen: NEGATIVE

## 2017-04-17 LAB — PROTIME-INR
INR: 1.04
Prothrombin Time: 13.6 seconds (ref 11.4–15.2)

## 2017-04-17 LAB — URINALYSIS, ROUTINE W REFLEX MICROSCOPIC
Bilirubin Urine: NEGATIVE
Glucose, UA: NEGATIVE mg/dL
Hgb urine dipstick: NEGATIVE
Ketones, ur: NEGATIVE mg/dL
Leukocytes, UA: NEGATIVE
Nitrite: NEGATIVE
Protein, ur: NEGATIVE mg/dL
Specific Gravity, Urine: 1.008 (ref 1.005–1.030)
pH: 6 (ref 5.0–8.0)

## 2017-04-17 LAB — APTT: aPTT: 27 seconds (ref 24–36)

## 2017-04-17 LAB — SURGICAL PCR SCREEN
MRSA, PCR: NEGATIVE
Staphylococcus aureus: NEGATIVE

## 2017-04-17 LAB — CREATININE, SERUM
Creatinine, Ser: 1.08 mg/dL (ref 0.61–1.24)
GFR calc Af Amer: >60 mL/min (ref >60)
GFR calc non Af Amer: >60 mL/min (ref >60)

## 2017-04-17 LAB — ABO/RH: ABO/RH(D): A POS

## 2017-04-17 SURGERY — ENDARTERECTOMY, CAROTID
Anesthesia: General | Site: Neck | Laterality: Left

## 2017-04-17 MED ORDER — PROMETHAZINE HCL 25 MG/ML IJ SOLN
6.2500 mg | INTRAMUSCULAR | Status: DC | PRN
  Administered 2017-04-17: 6.25 mg via INTRAVENOUS

## 2017-04-17 MED ORDER — SENNOSIDES-DOCUSATE SODIUM 8.6-50 MG PO TABS: 1.0000 | ORAL_TABLET | Freq: Every evening | ORAL | Status: DC | PRN

## 2017-04-17 MED ORDER — PHENOL 1.4 % MT LIQD: 1.0000 | OROMUCOSAL | Status: DC | PRN

## 2017-04-17 MED ORDER — LIDOCAINE HCL (PF) 1 % IJ SOLN
INTRAMUSCULAR | Status: AC
  Filled 2017-04-17: qty 30

## 2017-04-17 MED ORDER — PANTOPRAZOLE SODIUM 40 MG PO TBEC
40.0000 mg | DELAYED_RELEASE_TABLET | Freq: Every day | ORAL | Status: DC
  Filled 2017-04-17 (×2): qty 1

## 2017-04-17 MED ORDER — SUGAMMADEX SODIUM 200 MG/2ML IV SOLN
INTRAVENOUS | Status: AC
  Filled 2017-04-17: qty 2

## 2017-04-17 MED ORDER — MIDAZOLAM HCL 2 MG/2ML IJ SOLN
INTRAMUSCULAR | Status: AC
  Filled 2017-04-17: qty 2

## 2017-04-17 MED ORDER — EPHEDRINE 5 MG/ML INJ
INTRAVENOUS | Status: AC
  Filled 2017-04-17: qty 10

## 2017-04-17 MED ORDER — METOPROLOL TARTRATE 25 MG PO TABS: 25.0000 mg | ORAL_TABLET | Freq: Every day | ORAL | Status: DC

## 2017-04-17 MED ORDER — DOCUSATE SODIUM 100 MG PO CAPS
100.0000 mg | ORAL_CAPSULE | Freq: Every day | ORAL | Status: DC
  Administered 2017-04-18: 100 mg via ORAL
  Filled 2017-04-17: qty 1

## 2017-04-17 MED ORDER — MUPIROCIN 2 % EX OINT
1.0000 "application " | TOPICAL_OINTMENT | Freq: Once | CUTANEOUS | Status: AC
  Administered 2017-04-17: 1 via TOPICAL

## 2017-04-17 MED ORDER — FENTANYL CITRATE (PF) 250 MCG/5ML IJ SOLN
INTRAMUSCULAR | Status: AC
  Filled 2017-04-17: qty 5

## 2017-04-17 MED ORDER — MAGNESIUM SULFATE 2 GM/50ML IV SOLN: 2.0000 g | Freq: Every day | INTRAVENOUS | Status: DC | PRN

## 2017-04-17 MED ORDER — PROTAMINE SULFATE 10 MG/ML IV SOLN
INTRAVENOUS | Status: AC
  Filled 2017-04-17: qty 5

## 2017-04-17 MED ORDER — PHENYLEPHRINE 40 MCG/ML (10ML) SYRINGE FOR IV PUSH (FOR BLOOD PRESSURE SUPPORT)
PREFILLED_SYRINGE | INTRAVENOUS | Status: AC
  Filled 2017-04-17: qty 10

## 2017-04-17 MED ORDER — LOSARTAN POTASSIUM 25 MG PO TABS
25.0000 mg | ORAL_TABLET | Freq: Every day | ORAL | Status: DC
  Filled 2017-04-17: qty 1

## 2017-04-17 MED ORDER — GUAIFENESIN-DM 100-10 MG/5ML PO SYRP: 15.0000 mL | ORAL_SOLUTION | ORAL | Status: DC | PRN

## 2017-04-17 MED ORDER — 0.9 % SODIUM CHLORIDE (POUR BTL) OPTIME
TOPICAL | Status: DC | PRN
  Administered 2017-04-17: 2000 mL

## 2017-04-17 MED ORDER — DOPAMINE-DEXTROSE 3.2-5 MG/ML-% IV SOLN
INTRAVENOUS | Status: AC
  Administered 2017-04-17: 2.5 ug/kg/min via INTRAVENOUS
  Filled 2017-04-17: qty 250

## 2017-04-17 MED ORDER — ONDANSETRON HCL 4 MG/2ML IJ SOLN
INTRAMUSCULAR | Status: AC
  Filled 2017-04-17: qty 2

## 2017-04-17 MED ORDER — GLYCOPYRROLATE 0.2 MG/ML IJ SOLN
INTRAMUSCULAR | Status: DC | PRN
  Administered 2017-04-17: 0.2 mg via INTRAVENOUS
  Administered 2017-04-17: 0.1 mg via INTRAVENOUS

## 2017-04-17 MED ORDER — HYDRALAZINE HCL 20 MG/ML IJ SOLN: 5.0000 mg | INTRAMUSCULAR | Status: DC | PRN

## 2017-04-17 MED ORDER — PROPOFOL 10 MG/ML IV BOLUS
INTRAVENOUS | Status: DC | PRN
  Administered 2017-04-17: 110 mg via INTRAVENOUS

## 2017-04-17 MED ORDER — SODIUM CHLORIDE 0.9 % IJ SOLN
INTRAMUSCULAR | Status: AC
  Filled 2017-04-17: qty 10

## 2017-04-17 MED ORDER — ACETAMINOPHEN 650 MG RE SUPP: 325.0000 mg | RECTAL | Status: DC | PRN

## 2017-04-17 MED ORDER — PROPOFOL 10 MG/ML IV BOLUS
INTRAVENOUS | Status: AC
  Filled 2017-04-17: qty 20

## 2017-04-17 MED ORDER — PROTAMINE SULFATE 10 MG/ML IV SOLN
INTRAVENOUS | Status: DC | PRN
  Administered 2017-04-17: 10 mg via INTRAVENOUS
  Administered 2017-04-17 (×2): 20 mg via INTRAVENOUS

## 2017-04-17 MED ORDER — ROCURONIUM BROMIDE 100 MG/10ML IV SOLN
INTRAVENOUS | Status: DC | PRN
  Administered 2017-04-17: 50 mg via INTRAVENOUS
  Administered 2017-04-17: 20 mg via INTRAVENOUS

## 2017-04-17 MED ORDER — FLEET ENEMA 7-19 GM/118ML RE ENEM: 1.0000 | ENEMA | Freq: Once | RECTAL | Status: DC | PRN

## 2017-04-17 MED ORDER — DEXTROSE 5 % IV SOLN
1.5000 g | INTRAVENOUS | Status: DC
  Administered 2017-04-17: 1.5 g via INTRAVENOUS
  Filled 2017-04-17: qty 1.5

## 2017-04-17 MED ORDER — SODIUM CHLORIDE 0.9 % IV SOLN
500.0000 mL | Freq: Once | INTRAVENOUS | Status: AC | PRN
  Administered 2017-04-17: 500 mL via INTRAVENOUS

## 2017-04-17 MED ORDER — CHLORHEXIDINE GLUCONATE 4 % EX LIQD: 60.0000 mL | Freq: Once | CUTANEOUS | Status: DC

## 2017-04-17 MED ORDER — SODIUM CHLORIDE 0.9 % IV SOLN: INTRAVENOUS | Status: DC

## 2017-04-17 MED ORDER — ENOXAPARIN SODIUM 40 MG/0.4ML ~~LOC~~ SOLN: 40.0000 mg | SUBCUTANEOUS | Status: DC

## 2017-04-17 MED ORDER — ALUM & MAG HYDROXIDE-SIMETH 200-200-20 MG/5ML PO SUSP
15.0000 mL | ORAL | Status: DC | PRN
  Administered 2017-04-17: 30 mL via ORAL
  Filled 2017-04-17: qty 30

## 2017-04-17 MED ORDER — BISACODYL 10 MG RE SUPP: 10.0000 mg | Freq: Every day | RECTAL | Status: DC | PRN

## 2017-04-17 MED ORDER — PHENYLEPHRINE HCL 10 MG/ML IJ SOLN
INTRAVENOUS | Status: DC | PRN
  Administered 2017-04-17: 50 ug/min via INTRAVENOUS

## 2017-04-17 MED ORDER — METOPROLOL TARTRATE 5 MG/5ML IV SOLN: 2.0000 mg | INTRAVENOUS | Status: DC | PRN

## 2017-04-17 MED ORDER — ASPIRIN EC 325 MG PO TBEC
325.0000 mg | DELAYED_RELEASE_TABLET | Freq: Every day | ORAL | Status: DC
  Administered 2017-04-17 – 2017-04-18 (×2): 325 mg via ORAL
  Filled 2017-04-17 (×2): qty 1

## 2017-04-17 MED ORDER — DOPAMINE-DEXTROSE 3.2-5 MG/ML-% IV SOLN
2.0000 ug/kg/min | INTRAVENOUS | Status: DC
  Administered 2017-04-17: 2.5 ug/kg/min via INTRAVENOUS
  Administered 2017-04-17: 2 ug/kg/min via INTRAVENOUS

## 2017-04-17 MED ORDER — ARTIFICIAL TEARS OPHTHALMIC OINT
TOPICAL_OINTMENT | OPHTHALMIC | Status: AC
  Filled 2017-04-17: qty 3.5

## 2017-04-17 MED ORDER — OXYCODONE HCL 5 MG PO TABS
5.0000 mg | ORAL_TABLET | ORAL | Status: DC | PRN
  Administered 2017-04-17 (×2): 10 mg via ORAL
  Filled 2017-04-17 (×2): qty 2

## 2017-04-17 MED ORDER — LIDOCAINE HCL (CARDIAC) 20 MG/ML IV SOLN
INTRAVENOUS | Status: DC | PRN
  Administered 2017-04-17: 50 mg via INTRATRACHEAL

## 2017-04-17 MED ORDER — ONDANSETRON HCL 4 MG/2ML IJ SOLN
INTRAMUSCULAR | Status: DC | PRN
  Administered 2017-04-17: 4 mg via INTRAVENOUS

## 2017-04-17 MED ORDER — LACTATED RINGERS IV SOLN
INTRAVENOUS | Status: DC
Start: 2017-04-17 — End: 2017-04-18
  Administered 2017-04-17 (×2): via INTRAVENOUS

## 2017-04-17 MED ORDER — ACETAMINOPHEN 325 MG PO TABS: 325.0000 mg | ORAL_TABLET | ORAL | Status: DC | PRN

## 2017-04-17 MED ORDER — SODIUM CHLORIDE 0.9 % IV SOLN
INTRAVENOUS | Status: DC
  Administered 2017-04-17: 17:00:00 via INTRAVENOUS

## 2017-04-17 MED ORDER — PROMETHAZINE HCL 25 MG/ML IJ SOLN
INTRAMUSCULAR | Status: AC
  Administered 2017-04-17: 6.25 mg via INTRAVENOUS
  Filled 2017-04-17: qty 1

## 2017-04-17 MED ORDER — SODIUM CHLORIDE 0.9% FLUSH: 10.0000 mL | INTRAVENOUS | Status: DC | PRN

## 2017-04-17 MED ORDER — HYDROMORPHONE HCL 1 MG/ML IJ SOLN: 0.2500 mg | INTRAMUSCULAR | Status: DC | PRN

## 2017-04-17 MED ORDER — HEPARIN SODIUM (PORCINE) 5000 UNIT/ML IJ SOLN
INTRAMUSCULAR | Status: DC | PRN
  Administered 2017-04-17: 11:00:00 500 mL

## 2017-04-17 MED ORDER — MORPHINE SULFATE (PF) 2 MG/ML IV SOLN: 2.0000 mg | INTRAVENOUS | Status: DC | PRN

## 2017-04-17 MED ORDER — OMEGA-3-ACID ETHYL ESTERS 1 G PO CAPS
1000.0000 mg | ORAL_CAPSULE | Freq: Two times a day (BID) | ORAL | Status: DC
  Administered 2017-04-17 – 2017-04-18 (×2): 1000 mg via ORAL
  Filled 2017-04-17 (×2): qty 1

## 2017-04-17 MED ORDER — OXYCODONE HCL 5 MG PO TABS: 5.0000 mg | ORAL_TABLET | Freq: Once | ORAL | Status: DC | PRN

## 2017-04-17 MED ORDER — SUGAMMADEX SODIUM 200 MG/2ML IV SOLN
INTRAVENOUS | Status: DC | PRN
  Administered 2017-04-17: 200 mg via INTRAVENOUS

## 2017-04-17 MED ORDER — PHENYLEPHRINE HCL 10 MG/ML IJ SOLN
INTRAMUSCULAR | Status: DC | PRN
  Administered 2017-04-17 (×3): 80 ug via INTRAVENOUS
  Administered 2017-04-17: 120 ug via INTRAVENOUS

## 2017-04-17 MED ORDER — HEPARIN SODIUM (PORCINE) 1000 UNIT/ML IJ SOLN
INTRAMUSCULAR | Status: DC | PRN
  Administered 2017-04-17: 7000 [IU] via INTRAVENOUS
  Administered 2017-04-17: 2000 [IU] via INTRAVENOUS

## 2017-04-17 MED ORDER — POTASSIUM CHLORIDE CRYS ER 20 MEQ PO TBCR: 20.0000 meq | EXTENDED_RELEASE_TABLET | Freq: Every day | ORAL | Status: DC | PRN

## 2017-04-17 MED ORDER — LACTATED RINGERS IV SOLN: INTRAVENOUS | Status: DC

## 2017-04-17 MED ORDER — DEXTRAN 40 IN SALINE 10-0.9 % IV SOLN
INTRAVENOUS | Status: AC
  Filled 2017-04-17: qty 500

## 2017-04-17 MED ORDER — OXYCODONE HCL 5 MG/5ML PO SOLN: 5.0000 mg | Freq: Once | ORAL | Status: DC | PRN

## 2017-04-17 MED ORDER — LABETALOL HCL 5 MG/ML IV SOLN: 10.0000 mg | INTRAVENOUS | Status: DC | PRN

## 2017-04-17 MED ORDER — ONDANSETRON HCL 4 MG/2ML IJ SOLN: 4.0000 mg | Freq: Four times a day (QID) | INTRAMUSCULAR | Status: DC | PRN

## 2017-04-17 MED ORDER — EPHEDRINE SULFATE 50 MG/ML IJ SOLN
INTRAMUSCULAR | Status: DC | PRN
  Administered 2017-04-17 (×2): 5 mg via INTRAVENOUS
  Administered 2017-04-17: 15 mg via INTRAVENOUS

## 2017-04-17 MED ORDER — DEXTROSE 5 % IV SOLN
1.5000 g | Freq: Two times a day (BID) | INTRAVENOUS | Status: DC
  Administered 2017-04-17: 1.5 g via INTRAVENOUS
  Filled 2017-04-17 (×2): qty 1.5

## 2017-04-17 MED ORDER — ADULT MULTIVITAMIN W/MINERALS CH
1.0000 | ORAL_TABLET | Freq: Every day | ORAL | Status: DC
  Administered 2017-04-17 – 2017-04-18 (×2): 1 via ORAL
  Filled 2017-04-17 (×2): qty 1

## 2017-04-17 MED ORDER — DEXTRAN 40 IN SALINE 10-0.9 % IV SOLN
INTRAVENOUS | Status: AC | PRN
  Administered 2017-04-17: 500 mL

## 2017-04-17 MED ORDER — FENTANYL CITRATE (PF) 250 MCG/5ML IJ SOLN
INTRAMUSCULAR | Status: DC | PRN
  Administered 2017-04-17 (×2): 50 ug via INTRAVENOUS

## 2017-04-17 MED ORDER — HEMOSTATIC AGENTS (NO CHARGE) OPTIME
TOPICAL | Status: DC | PRN
  Administered 2017-04-17 (×2): 1 via TOPICAL

## 2017-04-17 MED ORDER — MUPIROCIN 2 % EX OINT
TOPICAL_OINTMENT | CUTANEOUS | Status: AC
  Filled 2017-04-17: qty 22

## 2017-04-17 SURGICAL SUPPLY — 53 items
BAG DECANTER FOR FLEXI CONT (MISCELLANEOUS) ×3 IMPLANT
CANISTER SUCT 3000ML PPV (MISCELLANEOUS) ×3 IMPLANT
CATH ROBINSON RED A/P 18FR (CATHETERS) ×3 IMPLANT
CLIP VESOCCLUDE MED 24/CT (CLIP) ×3 IMPLANT
CLIP VESOCCLUDE SM WIDE 24/CT (CLIP) ×3 IMPLANT
COVER PROBE W GEL 5X96 (DRAPES) IMPLANT
CRADLE DONUT ADULT HEAD (MISCELLANEOUS) ×3 IMPLANT
DERMABOND ADVANCED (GAUZE/BANDAGES/DRESSINGS) ×2
DERMABOND ADVANCED .7 DNX12 (GAUZE/BANDAGES/DRESSINGS) ×1 IMPLANT
ELECT REM PT RETURN 9FT ADLT (ELECTROSURGICAL) ×3
ELECTRODE REM PT RTRN 9FT ADLT (ELECTROSURGICAL) ×1 IMPLANT
GAUZE SPONGE 4X4 16PLY XRAY LF (GAUZE/BANDAGES/DRESSINGS) ×3 IMPLANT
GLOVE BIO SURGEON STRL SZ 6.5 (GLOVE) ×2 IMPLANT
GLOVE BIO SURGEON STRL SZ7 (GLOVE) ×9 IMPLANT
GLOVE BIO SURGEONS STRL SZ 6.5 (GLOVE) ×1
GLOVE BIOGEL PI IND STRL 6.5 (GLOVE) ×1 IMPLANT
GLOVE BIOGEL PI IND STRL 7.0 (GLOVE) ×2 IMPLANT
GLOVE BIOGEL PI IND STRL 7.5 (GLOVE) ×2 IMPLANT
GLOVE BIOGEL PI INDICATOR 6.5 (GLOVE) ×2
GLOVE BIOGEL PI INDICATOR 7.0 (GLOVE) ×4
GLOVE BIOGEL PI INDICATOR 7.5 (GLOVE) ×4
GLOVE ECLIPSE 7.0 STRL STRAW (GLOVE) ×3 IMPLANT
GOWN STRL REUS W/ TWL LRG LVL3 (GOWN DISPOSABLE) ×5 IMPLANT
GOWN STRL REUS W/TWL LRG LVL3 (GOWN DISPOSABLE) ×10
HEMOSTAT SPONGE AVITENE ULTRA (HEMOSTASIS) ×6 IMPLANT
IV ADAPTER SYR DOUBLE MALE LL (MISCELLANEOUS) IMPLANT
KIT BASIN OR (CUSTOM PROCEDURE TRAY) ×3 IMPLANT
KIT ROOM TURNOVER OR (KITS) ×3 IMPLANT
KIT SHUNT ARGYLE CAROTID ART 6 (VASCULAR PRODUCTS) ×3 IMPLANT
NEEDLE HYPO 25GX1X1/2 BEV (NEEDLE) IMPLANT
NS IRRIG 1000ML POUR BTL (IV SOLUTION) ×9 IMPLANT
PACK CAROTID (CUSTOM PROCEDURE TRAY) ×3 IMPLANT
PAD ARMBOARD 7.5X6 YLW CONV (MISCELLANEOUS) ×6 IMPLANT
PATCH VASC XENOSURE 1CMX6CM (Vascular Products) ×2 IMPLANT
PATCH VASC XENOSURE 1X6 (Vascular Products) ×1 IMPLANT
SET COLLECT BLD 21X3/4 12 PB (MISCELLANEOUS) IMPLANT
SHUNT CAROTID BYPASS 10 (VASCULAR PRODUCTS) IMPLANT
SHUNT CAROTID BYPASS 12FRX15.5 (VASCULAR PRODUCTS) IMPLANT
SUT ETHILON 3 0 PS 1 (SUTURE) IMPLANT
SUT MNCRL AB 4-0 PS2 18 (SUTURE) ×3 IMPLANT
SUT PROLENE 6 0 BV (SUTURE) ×30 IMPLANT
SUT PROLENE 6 0 C 1 24 (SUTURE) ×3 IMPLANT
SUT PROLENE 7 0 BV 1 (SUTURE) IMPLANT
SUT SILK 3 0 (SUTURE)
SUT SILK 3-0 18XBRD TIE 12 (SUTURE) IMPLANT
SUT VIC AB 3-0 SH 27 (SUTURE) ×2
SUT VIC AB 3-0 SH 27X BRD (SUTURE) ×1 IMPLANT
SYR 20CC LL (SYRINGE) ×3 IMPLANT
SYR TB 1ML LUER SLIP (SYRINGE) IMPLANT
SYSTEM CHEST DRAIN TLS 7FR (DRAIN) IMPLANT
TUBING ART PRESS 48 MALE/FEM (TUBING) IMPLANT
TUBING EXTENTION W/L.L. (IV SETS) IMPLANT
WATER STERILE IRR 1000ML POUR (IV SOLUTION) ×3 IMPLANT

## 2017-04-17 NOTE — Anesthesia Postprocedure Evaluation (Signed)
Anesthesia Post Note  Patient: Andrew [name]  Procedure(s) Performed: Procedure(s) (LRB): ENDARTERECTOMY CAROTID - LEFT (Left) PATCH ANGIOPLASTY (Left)     Patient location during evaluation: PACU Anesthesia Type: General Level of consciousness: awake and alert Pain management: pain level controlled Vital Signs Assessment: post-procedure vital signs reviewed and stable Respiratory status: spontaneous breathing, nonlabored ventilation and respiratory function stable Cardiovascular status: blood pressure returned to baseline and stable Postop Assessment: no signs of nausea or vomiting Anesthetic complications: no    Last Vitals:  Vitals:   04/17/17 1420 04/17/17 1435  BP: (!) 106/44 (!) 90/46  Pulse: 88 90  Resp: 20 17  Temp:    SpO2: 100% 99%    Last Pain:  Vitals:   04/17/17 1349  TempSrc:   PainSc: 0-No pain                 Lynda Rainwater

## 2017-04-17 NOTE — Anesthesia Preprocedure Evaluation (Addendum)
Anesthesia Evaluation  Patient identified by MRN, date of birth, ID band Patient awake    Reviewed: Allergy & Precautions, NPO status , Patient's Chart, lab work & pertinent test results, reviewed documented beta blocker date and time   Airway Mallampati: II  TM Distance: >3 FB Neck ROM: Full    Dental no notable dental hx. (+) Edentulous Upper, Edentulous Lower   Pulmonary neg pulmonary ROS, former smoker,    Pulmonary exam normal breath sounds clear to auscultation       Cardiovascular hypertension, Pt. on medications and Pt. on home beta blockers + Peripheral Vascular Disease  negative cardio ROS Normal cardiovascular exam Rhythm:Regular Rate:Normal     Neuro/Psych negative neurological ROS  negative psych ROS   GI/Hepatic negative GI ROS, Neg liver ROS,   Endo/Other  negative endocrine ROS  Renal/GU negative Renal ROS  negative genitourinary   Musculoskeletal negative musculoskeletal ROS (+) Arthritis ,   Abdominal   Peds negative pediatric ROS (+)  Hematology negative hematology ROS (+)   Anesthesia Other Findings Carotid stenosis  Reproductive/Obstetrics negative OB ROS                            Anesthesia Physical Anesthesia Plan  ASA: III  Anesthesia Plan: General   Post-op Pain Management:    Induction: Intravenous  PONV Risk Score and Plan: 2 and Ondansetron and Midazolam  Airway Management Planned: Oral ETT  Additional Equipment: Arterial line  Intra-op Plan:   Post-operative Plan: Extubation in OR  Informed Consent: I have reviewed the patients History and Physical, chart, labs and discussed the procedure including the risks, benefits and alternatives for the proposed anesthesia with the patient or authorized representative who has indicated his/her understanding and acceptance.   Dental advisory given  Plan Discussed with: CRNA  Anesthesia Plan Comments:          Anesthesia Quick Evaluation

## 2017-04-17 NOTE — Anesthesia Procedure Notes (Signed)
Arterial Line Insertion Start/End8/27/2018 9:15 AM, 04/17/2017 9:27 AM Performed by: Kerrie Pleasure P  Patient location: Pre-op. Preanesthetic checklist: patient identified, IV checked, risks and benefits discussed, surgical consent, pre-op evaluation and timeout performed Lidocaine 1% used for infiltration Left, radial was placed Catheter size: 20 G Hand hygiene performed  and maximum sterile barriers used   Attempts: 1 Procedure performed without using ultrasound guided technique. Post procedure assessment: normal and unchanged  Patient tolerated the procedure well with no immediate complications.

## 2017-04-17 NOTE — Transfer of Care (Signed)
Immediate Anesthesia Transfer of Care Note  Patient: Andrew [name]  Procedure(s) Performed: Procedure(s): ENDARTERECTOMY CAROTID - LEFT (Left) PATCH ANGIOPLASTY (Left)  Patient Location: PACU  Anesthesia Type:General  Level of Consciousness: awake, alert  and oriented  Airway & Oxygen Therapy: Patient Spontanous Breathing and Patient connected to nasal cannula oxygen  Post-op Assessment: Report given to RN, Post -op Vital signs reviewed and stable, Patient moving all extremities X 4 and Patient able to stick tongue midline  Post vital signs: Reviewed and stable  Last Vitals:  Vitals:   04/17/17 0753 04/17/17 1349  BP: (!) 189/67 (!) 83/43  Pulse:  65  Resp:  11  Temp:  36.5 C  SpO2:  100%    Last Pain:  Vitals:   04/17/17 1349  TempSrc:   PainSc: 0-No pain      Patients Stated Pain Goal: 2 (06/00/45 9977)  Complications: No apparent anesthesia complications

## 2017-04-17 NOTE — Interval H&P Note (Signed)
   History and Physical Update  The patient was interviewed and re-examined.  The patient's previous History and Physical has been reviewed and is unchanged from my consult.  There is no change in the plan of care: L CEA.  Adele Barthel, MD, FACS [facility] Office: [phone] Pager: [phone]  04/17/2017, 7:01 AM

## 2017-04-17 NOTE — Op Note (Signed)
OPERATIVE NOTE  PROCEDURE:   1.  left carotid endarterectomy with bovine patch angioplasty 2.  left intraoperative carotid ultrasound  PRE-OPERATIVE DIAGNOSIS: left asymptomatic carotid stenosis >90%  POST-OPERATIVE DIAGNOSIS: same as above   SURGEON: Adele Barthel, MD  ASSISTANT(S): Gerri Lins, PAC   ANESTHESIA: general  ESTIMATED BLOOD LOSS: 250 cc  FINDING(S): 1.  Continuous Doppler audible flow signatures are appropriate for each carotid artery. 2.  No evidence of intimal flap visualized on transverse or longitudinal ultrasonography. 3.  Carotid plaque: necrotic core, near occluded, calcified, extended more proximal than anticipated 4.  Vagus nerve: normal position 5.  Hypoglossal nerve: not visualized, did not required mobilization  SPECIMEN(S):  None  INDICATIONS:   Andrew [name] is a 83 y.o. male who presents with left asymptomatic carotid stenosis >90%.  I discussed with the patient the risks, benefits, and alternatives to carotid endarterectomy.  I discussed the procedural details of carotid endarterectomy with the patient.  The patient is aware that the risks of carotid endarterectomy include but are not limited to: bleeding, infection, stroke, myocardial infarction, death, cranial nerve injuries both temporary and permanent, neck hematoma, possible airway compromise, labile blood pressure post-operatively, cerebral hyperperfusion syndrome, and possible need for additional interventions in the future. The patient is aware of the risks and agrees to proceed forward with the procedure.   DESCRIPTION: After full informed written consent was obtained from the patient, the patient was brought back to the operating room and placed supine upon the operating table.  Prior to induction, the patient received IV antibiotics.  After obtaining adequate anesthesia, the patient was placed into semi-Fowler position with a shoulder roll in place and the patient's neck slightly  hyperextended and rotated away from the surgical site.    The patient was prepped in the standard fashion for a left carotid endarterectomy.  I made an incision anterior to the sternocleidomastoid muscle and dissected down through the subcutaneous tissue.  The platysmas was opened with electrocautery.  Then I dissected down to the internal jugular vein.  This was dissected posteriorly until I obtained visualization of the common carotid artery.  This was dissected out and then an umbilical tape was placed around the common carotid artery and I loosely applied a Rumel tourniquet.  I then dissected in a periadventitial fashion along the common carotid artery up to the bifurcation.  I then identified the external carotid artery and the superior thyroid artery.  A 2-0 silk tie was looped around the superior thyroid artery, and I also dissected out the external carotid artery and placed a vessel loop around it.  In continuing the dissection to the internal carotid artery, I identified the facial vein.  This was ligated and then transected, giving me improved exposure of the internal carotid artery.  In the process of this dissection, the hypoglossal nerve was identified.  I then dissected out the internal carotid artery until I identified an area of soft tissue in the internal carotid artery.  I dissected slightly distal to this area, and placed an umbilical tape around the artery and loosely applied a Rumel tourniquet.    At this point, we gave the patient a therapeutic bolus of Heparin intravenously, 9000 units (roughly 100 units/kg).  After waiting 3 minutes, then I clamped the internal carotid artery, external carotid artery and then the common carotid artery.  I then made an arteriotomy in the common carotid artery with a 11 blade, and extended the arteriotomy with a Potts scissor  down into the common carotid artery, then I carried the arteriotomy through the bifurcation into the internal carotid artery until I  reached an area that was not diseased.  The diseased segment extended longer than anticipated.   At this point, I took the 10 shunt that previously been prepared and I inserted it into the internal carotid artery.  The Rumel tourniquet was then applied to this end of the shunt.  I unclamped the shunt to verify retrograde blood flow in the internal carotid artery.  I then placed the other end of the shunt into the common carotid artery after unclamping the artery.  The Rumel was tightened down around the shunt.  At this point, I verified blood flow in the shunt with a continuous doppler.  At this point, I started the endarterectomy in the common carotid artery with a Technical brewer and carried this dissection down into the common carotid artery circumferentially.  Then I transected the plaque at a segment where it was adherent.  I then carried this dissection up into the external carotid artery.  The plaque was extracted by unclamping the external carotid artery and everting the artery.  The dissection was then carried into the internal carotid artery, extracting the remaining portion of the carotid plaque.  The plaque feathered into the high segment of the internal carotid artery.  I passed the plaque off the field.    I then spent the next 30 minutes removing intimal flaps and loose debris.  Eventually I reached the point where the residual plaque was densely adherent and any further dissection would compromise the integrity of the wall.  After verifying that there was no more loose intimal flaps or debris, I re-interrogated the entirety of this carotid artery.  At this point, I was satisfied that the minimal remaining disease was densely adherent to the wall and wall integrity was intact.  At this point, I then fashioned a bovine pericardial patch for the geometry of this artery and sewed it in place with two running stitch of 6-0 Prolene, one from each end.  Prior to completing this patch angioplasty, I  removed the shunt first from the internal carotid artery, from which there was excellent backbleeding, and clamped it.  Then I removed the shunt from the common carotid artery, from which there was excellent antegrade bleeding, and then clamped it.  At this point, I allowed the external carotid artery to backbleed, which was excellent.  Then I instilled heparinized saline in this patched artery and then completed the patch angioplasty in the usual fashion.    At this point, I first released the clamp on the external carotid artery, then I released it on the common carotid artery.  After waiting a few seconds, I then released it on the internal carotid artery.  I had to spend more time than usual repairing pleats in this the suture of this patch angioplasty with 6-0 Prolene stitches.  I then interrogated this patient's arteries with the continuous Doppler.  The audible waveforms in each artery were consistent with the expected characteristics for each artery.  The Sonosite probe was then sterilely draped and used to interrogate the carotid artery in both longitudinal and transverse views.  At this point, I washed out the wound, and placed Avitene throughout.  I also gave the patient 50 mg of protamine to reverse his anticoagulation.   After waiting a few minutes, I removed the Avitene and washed out the wound.  There was no more active  bleeding in the surgical site.     I then reapproximated the platysma muscle with a running stitch of 3-0 Vicryl.  The skin was then reapproximated with a running subcuticular 4-0 Monocryl stitch.  The skin was then cleaned, dried and LiquidBand was used to reinforce the skin closure.    The patient woke without any problems, neurologically intact.    COMPLICATIONS: none  CONDITION: stable   Adele Barthel, MD, [facility] [place] Office: [phone] Pager: [phone]  04/17/2017, 1:21 PM

## 2017-04-17 NOTE — H&P (View-Only) (Signed)
New Carotid Patient  Requested by:  Sasser, Silvestre Moment, MD [place], [place] [address]  Reason for consultation: left carotid stenosis   History of Present Illness   Andrew [name] is a 83 y.o. (Nov 11, 1930) male who presents with chief complaint: blocked artery in neck.    This patient recently fell which lead to cardiac work-up and B carotid duplex.  Previous carotid studies demonstrated: RICA 9-38% stenosis, LICA 10-17% stenosis.  Patient has nohistory of TIA or stroke symptom.  The patient has never had amaurosis fugax or monocular blindness.  The patient has never had facial drooping or hemiplegia.  The patient has never had receptive or expressive aphasia.     Pt's recent stress testing was normal by report.  The patient's risks factors for carotid disease include: HTN, former smoking.   Past Medical History:  Diagnosis Date  . Hypertension     Past Surgical History:  Procedure Laterality Date  . COLONOSCOPY N/A 01/19/2017   Procedure: COLONOSCOPY;  Surgeon: Daneil Dolin, MD;  Location: [facility];  Service: Endoscopy;  Laterality: N/A;  1:15pm  . ESOPHAGOGASTRODUODENOSCOPY N/A 01/19/2017   Procedure: ESOPHAGOGASTRODUODENOSCOPY (EGD);  Surgeon: Daneil Dolin, MD;  Location: [facility];  Service: Endoscopy;  Laterality: N/A;    Social History   Social History  . Marital status: Married    Spouse name: N/A  . Number of children: N/A  . Years of education: N/A   Occupational History  . Not on file.   Social History Main Topics  . Smoking status: Former Smoker    Quit date: 1980  . Smokeless tobacco: Never Used  . Alcohol use No  . Drug use: No  . Sexual activity: No   Other Topics Concern  . Not on file   Social History Narrative  . No narrative on file    Family History  Problem Relation Age of Onset  . Colon cancer Neg Hx   . Colon polyps Neg Hx     Current Outpatient Prescriptions  Medication Sig Dispense Refill  .  HYDROcodone-acetaminophen (NORCO) 7.5-325 MG tablet Take 1 tablet by mouth every 6 (six) hours as needed for moderate pain.    Marland Kitchen losartan (COZAAR) 100 MG tablet Take 0.5 tablets (50 mg total) by mouth daily.    Marland Kitchen losartan (COZAAR) 25 MG tablet Take 1 tablet (25 mg total) by mouth daily. 90 tablet 3  . Omega-3 Fatty Acids (FISH OIL) 1000 MG CAPS Take 1,000 mg by mouth 3 (three) times daily.      No current facility-administered medications for this visit.     No Known Allergies  REVIEW OF SYSTEMS (negative unless checked):   Cardiac:  []  Chest pain or chest pressure? []  Shortness of breath upon activity? []  Shortness of breath when lying flat? []  Irregular heart rhythm?  Vascular:  []  Pain in calf, thigh, or hip brought on by walking? []  Pain in feet at night that wakes you up from your sleep? []  Blood clot in your veins? []  Leg swelling?  Pulmonary:  []  Oxygen at home? []  Productive cough? []  Wheezing?  Neurologic:  []  Sudden weakness in arms or legs? []  Sudden numbness in arms or legs? []  Sudden onset of difficult speaking or slurred speech? []  Temporary loss of vision in one eye? []  Problems with dizziness?  Gastrointestinal:  []  Blood in stool? []  Vomited blood?  Genitourinary:  []  Burning when urinating? []  Blood in urine?  Psychiatric:  []   Major depression  Hematologic:  []  Bleeding problems? []  Problems with blood clotting?  Dermatologic:  []  Rashes or ulcers?  Constitutional:  []  Fever or chills?  Ear/Nose/Throat:  []  Change in hearing? []  Nose bleeds? []  Sore throat?  Musculoskeletal:  []  Back pain? []  Joint pain? []  Muscle pain?   For VQI Use Only   PRE-ADM LIVING Home  AMB STATUS Ambulatory  CAD Sx None  PRIOR CHF None  STRESS TEST Normal    Physical Examination     Vitals:   04/14/17 0952  BP: (!) 160/66  Pulse: 65  Weight: 149 lb 3.2 oz (67.7 kg)  Height: 5\' 11"  (1.803 m)   Body mass index is 20.81 kg/m.  General  Alert, O x 3, WD, NAD  Head [place]/AT,    Ear/Nose/ Throat Hearing grossly intact, nares without erythema or drainage, oropharynx with Erythema without Exudate, Mallampati score: 3,   Eyes PERRLA, EOMI,    Neck Supple, mid-line trachea,    Pulmonary Sym exp, good B air movt, CTA B  Cardiac RRR, Nl S1, S2, no Murmurs, No rubs, No S3,S4  Vascular Vessel Right Left  Radial Palpable Palpable  Brachial Palpable Palpable  Carotid Palpable, No Bruit Palpable, No Bruit  Aorta Not palpable N/A  Femoral Palpable Palpable  Popliteal Not palpable Not palpable  PT Palpable Palpable  DP Palpable Palpable    Gastro- intestinal soft, non-distended, non-tender to palpation, No guarding or rebound, no HSM, no masses, no CVAT B, No palpable prominent aortic pulse,    Musculo- skeletal M/S 5/5 throughout  , Extremities without ischemic changes  , No edema present, No visible varicosities , No Lipodermatosclerosis present  Neurologic Cranial nerves 2-12 intact , Pain and light touch intact in extremities , Motor exam as listed above  Psychiatric Judgement intact, Mood & affect appropriate for pt's clinical situation  Dermatologic See M/S exam for extremity exam, No rashes otherwise noted  Lymphatic  Palpable lymph nodes: None     Non-invasive Vascular Imaging   Outside B carotid duplex (04/06/17)  R: 1-39%  L: 80-99% (566/180 c/s)  Nuclear Stress Test (04/07/17)  There was no ST segment deviation noted during stress.  Defect 1: There is a medium defect of moderate severity present in the basal inferoseptal, basal inferior and mid inferior location. This appears to be due to soft tissue attenuation artifact.  This is a low risk study. No ischemic territories.  Nuclear stress EF: 52%.       Repeat L Carotid Duplex (04/14/2017):  L ICA stenosis:  80-99% (495/160 c/s, normal position) L VA: patent and antegrade   Outside Studies/Documentation   3 pages of outside documents were reviewed  including: outside PCP charts.   Medical Decision Making   Andrew [name] is a 83 y.o. male who presents with: asx L ICA stenosis likely >90%, asx R ICA stenosis 1-39%.   Based on the patient's vascular studies and examination, I have offered the patient: L CEA. I discussed with the patient the risks, benefits, and alternatives to carotid endarterectomy.   The patient is aware that the risks of carotid endarterectomy include but are not limited to: bleeding, infection, stroke, myocardial infarction, death, cranial nerve injuries both temporary and permanent, neck hematoma, possible airway compromise, labile blood pressure post-operatively, cerebral hyperperfusion syndrome, and possible need for additional interventions in the future.  The patient is aware of the risks and agrees to proceed forward with the procedure.  I have scheduled him urgently for  this coming Monday, 27 AUG 18. I discussed in depth with the patient the nature of atherosclerosis, and emphasized the importance of maximal medical management including strict control of blood pressure, blood glucose, and lipid levels, obtaining regular exercise, antiplatelet agents, and cessation of smoking.   The patient is currently not on on statin as not medically indicated.  The patient is currently on an anti-platelet: ASA.  The patient is aware that without maximal medical management the underlying atherosclerotic disease process will progress, limiting the benefit of any interventions.  Thank you for allowing [place] to participate in this patient's care.   Adele Barthel, MD, FACS [facility] [place] Office: [phone] Pager: [phone]  04/14/2017, 12:56 PM

## 2017-04-17 NOTE — Anesthesia Procedure Notes (Signed)
Procedure Name: Intubation Date/Time: 04/17/2017 10:30 AM Performed by: Mariea Clonts Pre-anesthesia Checklist: Patient identified, Emergency Drugs available, Suction available and Patient being monitored Patient Re-evaluated:Patient Re-evaluated prior to induction Oxygen Delivery Method: Circle System Utilized Preoxygenation: Pre-oxygenation with 100% oxygen Induction Type: IV induction Ventilation: Mask ventilation without difficulty Laryngoscope Size: Mac and 4 Grade View: Grade I Tube type: Oral Tube size: 7.5 mm Number of attempts: 1 Airway Equipment and Method: Stylet and Oral airway Placement Confirmation: ETT inserted through vocal cords under direct vision,  positive ETCO2 and breath sounds checked- equal and bilateral Tube secured with: Tape Dental Injury: Teeth and Oropharynx as per pre-operative assessment

## 2017-04-18 ENCOUNTER — Encounter (HOSPITAL_COMMUNITY): Payer: Self-pay | Admitting: Vascular Surgery

## 2017-04-18 LAB — CBC
HCT: 25.8 % — ABNORMAL LOW (ref 39.0–52.0)
Hemoglobin: 8.5 g/dL — ABNORMAL LOW (ref 13.0–17.0)
MCH: 30.4 pg (ref 26.0–34.0)
MCHC: 32.9 g/dL (ref 30.0–36.0)
MCV: 92.1 fL (ref 78.0–100.0)
Platelets: 160 10*3/uL (ref 150–400)
RBC: 2.8 MIL/uL — ABNORMAL LOW (ref 4.22–5.81)
RDW: 13.4 % (ref 11.5–15.5)
WBC: 10.6 10*3/uL — ABNORMAL HIGH (ref 4.0–10.5)

## 2017-04-18 LAB — BASIC METABOLIC PANEL
Anion gap: 8 (ref 5–15)
BUN: 23 mg/dL — ABNORMAL HIGH (ref 6–20)
CO2: 24 mmol/L (ref 22–32)
Calcium: 8.3 mg/dL — ABNORMAL LOW (ref 8.9–10.3)
Chloride: 104 mmol/L (ref 101–111)
Creatinine, Ser: 1.23 mg/dL (ref 0.61–1.24)
GFR calc Af Amer: >60 mL/min (ref >60)
GFR calc non Af Amer: 52 mL/min — ABNORMAL LOW (ref >60)
Glucose, Bld: 128 mg/dL — ABNORMAL HIGH (ref 65–99)
Potassium: 4.3 mmol/L (ref 3.5–5.1)
Sodium: 136 mmol/L (ref 135–145)

## 2017-04-18 MED ORDER — ASPIRIN EC 81 MG PO TBEC: 81.0000 mg | DELAYED_RELEASE_TABLET | Freq: Every day | ORAL | Status: DC

## 2017-04-18 MED ORDER — LOSARTAN POTASSIUM 100 MG PO TABS: 50.0000 mg | ORAL_TABLET | Freq: Every day | ORAL | Status: DC

## 2017-04-18 MED ORDER — METOPROLOL TARTRATE 25 MG PO TABS: 25.0000 mg | ORAL_TABLET | Freq: Every day | ORAL | Status: DC

## 2017-04-18 MED ORDER — LOSARTAN POTASSIUM 25 MG PO TABS: 25.0000 mg | ORAL_TABLET | Freq: Every day | ORAL | 3 refills | Status: DC

## 2017-04-18 MED ORDER — HYDROCODONE-ACETAMINOPHEN 7.5-325 MG PO TABS: 1.0000 | ORAL_TABLET | Freq: Two times a day (BID) | ORAL | 0 refills | Status: DC | PRN

## 2017-04-18 NOTE — Discharge Instructions (Signed)
[facility]  Discharge Instructions   Carotid Endarterectomy (CEA)  Please refer to the following instructions for your post-procedure care. Your surgeon or physician assistant will discuss any changes with you.  Activity  You are encouraged to walk as much as you can. You can slowly return to normal activities but must avoid strenuous activity and heavy lifting until your doctor tell you it's OK. Avoid activities such as vacuuming or swinging a golf club. You can drive after one week if you are comfortable and you are no longer taking prescription pain medications. It is normal to feel tired for serval weeks after your surgery. It is also normal to have difficulty with sleep habits, eating, and bowel movements after surgery. These will go away with time.  Bathing/Showering  You may shower after you go home. Do not soak in a bathtub, hot tub, or swim until the incision heals completely.  Incision Care  Shower every day. Clean your incision with mild soap and water. Pat the area dry with a clean towel. You do not need a bandage unless otherwise instructed. Do not apply any ointments or creams to your incision. You may have skin glue on your incision. Do not peel it off. It will come off on its own in about one week. Your incision may feel thickened and raised for several weeks after your surgery. This is normal and the skin will soften over time. For Men Only: It's OK to shave around the incision but do not shave the incision itself for 2 weeks. It is common to have numbness under your chin that could last for several months.  Diet  Resume your normal diet. There are no special food restrictions following this procedure. A low fat/low cholesterol diet is recommended for all patients with vascular disease. In order to heal from your surgery, it is CRITICAL to get adequate nutrition. Your body requires vitamins, minerals, and protein. Vegetables are the best  source of vitamins and minerals. Vegetables also provide the perfect balance of protein. Processed food has little nutritional value, so try to avoid this.  Medications  Resume taking all of your medications unless your doctor or physician assistant tells you not to. Please hold your blood pressure medications if your systolic (top number) blood pressure reading is less than 140.  If your incision is causing pain, you may take over-the- counter pain relievers such as acetaminophen (Tylenol). If you were prescribed a stronger pain medication, please be aware these medications can cause nausea and constipation. Prevent nausea by taking the medication with a snack or meal. Avoid constipation by drinking plenty of fluids and eating foods with a high amount of fiber, such as fruits, vegetables, and grains. Do not take Tylenol if you are taking prescription pain medications.  Follow Up  Our office will schedule a follow up appointment 2-3 weeks following discharge.  Please call us immediately for any of the following conditions  Increased pain, redness, drainage (pus) from your incision site. Fever of 101 degrees or higher. If you should develop stroke (slurred speech, difficulty swallowing, weakness on one side of your body, loss of vision) you should call 911 and go to the nearest emergency room.  Reduce your risk of vascular disease:  Stop smoking. If you would like help call [facility] at 1-800-QUIT-NOW 929-610-2535) or [place] at [phone]. Manage your cholesterol Maintain a desired weight Control your diabetes Keep your blood pressure down  If you have any questions, please  call the office at [phone].

## 2017-04-18 NOTE — Progress Notes (Signed)
Late entry ; assumed care from going RN; patient sitting on side of bed with no c/o's; denies pain; SOB; MD rounded; patient will be discharge today

## 2017-04-18 NOTE — Progress Notes (Signed)
Patient worked for about 450 feet this morning. Tolerated well.

## 2017-04-18 NOTE — Addendum Note (Signed)
Addendum  created 04/18/17 1427 by Mariea Clonts, CRNA   Anesthesia Intra Meds edited

## 2017-04-18 NOTE — Discharge Summary (Signed)
[facility] Discharge Summary   Patient ID:  Andrew [name] MRN: [id number] DOB/AGE: [birth year] 83 y.o.  Admit date: 04/17/2017 Discharge date: 04/18/2017 Date of Surgery: 04/17/2017 Surgeon: Surgeon(s): Conrad Niangua, MD  Admission Diagnosis: Right Internal Carotid Artery Stenosis   I65.21  Discharge Diagnoses:  Right Internal Carotid Artery Stenosis   I65.21  Secondary Diagnoses: Past Medical History:  Diagnosis Date  . Arthritis   . BPH (benign prostatic hyperplasia)   . Carotid artery stenosis    left  . Hypertension     Procedure(s): ENDARTERECTOMY CAROTID - LEFT PATCH ANGIOPLASTY  Discharged Condition: good  HPI: Andrew [name] is a 83 y.o. (1931-07-06) male who presents with chief complaint: blocked artery in neck.    This patient recently fell which lead to cardiac work-up and B carotid duplex.  Previous carotid studies demonstrated: RICA 1-91% stenosis, LICA 47-82% stenosis.  Patient has nohistory of TIA or stroke symptom.  The patient has never had amaurosis fugax or monocular blindness.  The patient has never had facial drooping or hemiplegia.  The patient has never had receptive or expressive aphasia.     Pt's recent stress testing was normal by report.  The patient's risks factors for carotid disease include: HTN, former smoking.   Hospital Course:  [name] is a 83 y.o. male is S/P  Procedure(s): ENDARTERECTOMY CAROTID - LEFT PATCH ANGIOPLASTY  Left neck incision healing well with out hematoma Grip 5/5 equal B No tongue deviation and smile is symmetric Heart RRR Lungs non labored breathing  Assessment/Planning: POD # 1 left CEA  He has ambulated, tolerating PO's and voided minimally.  He has a history of enlarged prostate.  He has a future f/u with a urologist appt. He has been hypotensive we will hold his BP medication and he agrees to monitor it at home.  If the systolic is 956 or greater he will resume.   F/U with  cardiology 05/02/2017 Jory Sims Dr. Bridgett Larsson f/u in 2 weeks   Significant Diagnostic Studies: CBC Lab Results  Component Value Date   WBC 10.6 (H) 04/18/2017   HGB 8.5 (L) 04/18/2017   HCT 25.8 (L) 04/18/2017   MCV 92.1 04/18/2017   PLT 160 04/18/2017    BMET    Component Value Date/Time   NA 136 04/18/2017 0434   K 4.3 04/18/2017 0434   CL 104 04/18/2017 0434   CO2 24 04/18/2017 0434   GLUCOSE 128 (H) 04/18/2017 0434   BUN 23 (H) 04/18/2017 0434   CREATININE 1.23 04/18/2017 0434   CALCIUM 8.3 (L) 04/18/2017 0434   GFRNONAA 52 (L) 04/18/2017 0434   GFRAA >60 04/18/2017 0434   COAG Lab Results  Component Value Date   INR 1.04 04/17/2017     Disposition:  Discharge to :Home Discharge Instructions    CAROTID Sugery: Call MD for difficulty swallowing or speaking; weakness in arms or legs that is a new symtom; severe headache.  If you have increased swelling in the neck and/or  are having difficulty breathing, CALL [phone]    Complete by:  As directed    Call MD for:  redness, tenderness, or signs of infection (pain, swelling, bleeding, redness, odor or green/yellow discharge around incision site)    Complete by:  As directed    Call MD for:  severe or increased pain, loss or decreased feeling  in affected limb(s)    Complete by:  As directed    Call MD for:  temperature >  100.5    Complete by:  As directed    Resume previous diet    Complete by:  As directed      Allergies as of 04/18/2017   No Known Allergies     Medication List    TAKE these medications   acetaminophen 500 MG tablet Commonly known as:  TYLENOL Take 1,000 mg by mouth 2 (two) times daily as needed for moderate pain.   aspirin EC 81 MG tablet Take 1 tablet (81 mg total) by mouth daily.   Fish Oil 1200 MG Caps Take 1,200 mg by mouth 2 (two) times daily.   HYDROcodone-acetaminophen 7.5-325 MG tablet Commonly known as:  NORCO Take 1 tablet by mouth 2 (two) times daily as needed for moderate  pain.   ICAPS PO Take 1 tablet by mouth daily.   losartan 100 MG tablet Commonly known as:  COZAAR Take 0.5 tablets (50 mg total) by mouth daily. Please do not take if systolic blood pressure is less than 140. What changed:  additional instructions   losartan 25 MG tablet Commonly known as:  COZAAR Take 1 tablet (25 mg total) by mouth daily. Please do not take if systolic blood pressure is less than 140 What changed:  additional instructions   metoprolol tartrate 25 MG tablet Commonly known as:  LOPRESSOR Take 1 tablet (25 mg total) by mouth daily. Please do not take if systolic blood pressure is less than 140. What changed:  additional instructions            Discharge Care Instructions        Start     Ordered   04/18/17 0000  HYDROcodone-acetaminophen (NORCO) 7.5-325 MG tablet  2 times daily PRN    Question:  Supervising Provider  Answer:  Conrad [place]   04/18/17 0759   04/18/17 0000  losartan (COZAAR) 100 MG tablet  Daily    Comments:  Patient will use his 100mg  tab he has at home since he just received a 90 day supply.   04/18/17 0759   04/18/17 0000  losartan (COZAAR) 25 MG tablet  Daily    Comments:  To be taken in addition to the Losartan 100mg  (taking 1/2 tab to equal 50mg ).  TOTAL OF 75MG  DAILY.   04/18/17 0759   04/18/17 0000  metoprolol tartrate (LOPRESSOR) 25 MG tablet  Daily     04/18/17 0759   04/18/17 0000  aspirin EC 81 MG tablet  Daily     04/18/17 0759   04/18/17 0000  Resume previous diet     04/18/17 0759   04/18/17 0000  Call MD for:  temperature >100.5     04/18/17 0759   04/18/17 0000  Call MD for:  redness, tenderness, or signs of infection (pain, swelling, bleeding, redness, odor or green/yellow discharge around incision site)     04/18/17 0759   04/18/17 0000  Call MD for:  severe or increased pain, loss or decreased feeling  in affected limb(s)     04/18/17 0759   04/18/17 0000  CAROTID Sugery: Call MD for difficulty swallowing or  speaking; weakness in arms or legs that is a new symtom; severe headache.  If you have increased swelling in the neck and/or  are having difficulty breathing, CALL 911     04/18/17 0759     Verbal and written Discharge instructions given to the patient. Wound care per Discharge AVS Follow-up Information    Conrad [place], MD Follow up in  2 week(s).   Specialties:  Vascular Surgery, Cardiology Why:  Our office will call you to arrange an appointment  Contact information: [place] [address] [phone]        Lendon Colonel, NP Follow up on 05/02/2017.   Specialties:  Nurse Practitioner, Radiology, Cardiology Why:  @3 :30 pm to follow-up regarding blood pressure; spoke to [employer] information: [place] [address] [phone]           Signed: Roxy Horseman 04/18/2017, 10:19 AM   Addendum  [name] is a 83 y.o. ([birth year]) male who underwent L CEA for an asx L ICA stenosis >90%.  His procedure was uneventful and his post-operative course was similarly uneventful.  This patient was discharge with neuro status intact without surgical complications.  He will follow up in the next 2 weeks for recheck on incision.   Adele Barthel, MD, FACS [facility] [place] Office: [phone] Pager: [phone]  04/19/2017, 12:52 PM   --- For VQI Registry use --- Instructions: Press F2 to tab through selections.  Delete question if not applicable.   Modified Rankin score at D/C (0-6): Rankin Score=0  IV medication needed for:  1. Hypertension: No 2. Hypotension: Yes  Post-op Complications: No  1. Post-op CVA or TIA: No  If yes: Event classification (right eye, left eye, right cortical, left cortical, verterobasilar, other):   If yes: Timing of event (intra-op, <6 hrs post-op, >=6 hrs post-op, unknown):   2. CN injury: No  If yes: CN  injuried   3. Myocardial infarction: No  If yes: Dx by (EKG or  clinical, Troponin):   4.  CHF: No  5.  Dysrhythmia (new): No  6. Wound infection: No  7. Reperfusion symptoms: No  8. Return to OR: No  If yes: return to OR for (bleeding, neurologic, other CEA incision, other):   Discharge medications: Statin use:  No  for medical reason   ASA use:  Yes Beta blocker use:  Yes ACE-Inhibitor use:  No  for medical reason   P2Y12 Antagonist use: [x ] None, [ ]  Plavix, [ ]  Plasugrel, [ ]  Ticlopinine, [ ]  Ticagrelor, [ ]  Other, [ ]  No for medical reason, [ ]  Non-compliant, [ ]  Not-indicated Anti-coagulant use:  [x ] None, [ ]  Warfarin, [ ]  Rivaroxaban, [ ]  Dabigatran, [ ]  Other, [ ]  No for medical reason, [ ]  Non-compliant, [ ]  Not-indicated

## 2017-04-18 NOTE — Progress Notes (Addendum)
[facility] [place]  Subjective  - Doing very well.   Objective (!) 106/44 63 99 F (37.2 C) (Oral) 18 100%  Intake/Output Summary (Last 24 hours) at 04/18/17 0734 Last data filed at 04/17/17 1820  Gross per 24 hour  Intake             1900 ml  Output             1310 ml  Net              590 ml    Left neck incision healing well with out hematoma Grip 5/5 equal B No tongue deviation and smile is symmetric Heart RRR Lungs non labored breathing  Assessment/Planning: POD # 1 left CEA  He has ambulated, tolerating PO's and voided minimally.  He has a history of enlarged prostate.  He has a future f/u with a urologist appt. He has been hypotensive we will hold his BP medication and he agrees to monitor it at home.  If the systolic is 151 or greater he will resume.   F/U with cardiology 05/02/2017 Jory Sims Dr. Bridgett Larsson f/u in 2 weeks  Theda Sers [place] 04/18/2017 7:34 AM    Addendum  I have independently interviewed and examined the patient, and I agree with the physician assistant's findings.  Neuro intact.  No neck hematoma.  Swallow ok.  Adele Barthel, MD, FACS [facility] [place] Office: [phone] Pager: [phone]  04/18/2017, 10:39 AM   --  Laboratory Lab Results:  Recent Labs  04/17/17 1945 04/18/17 0434  WBC 13.0* 10.6*  HGB 9.4* 8.5*  HCT 28.3* 25.8*  PLT 170 160   BMET  Recent Labs  04/17/17 0706 04/17/17 1945 04/18/17 0434  NA 136  --  136  K 4.5  --  4.3  CL 104  --  104  CO2 24  --  24  GLUCOSE 121*  --  128*  BUN 22*  --  23*  CREATININE 0.84 1.08 1.23  CALCIUM 9.5  --  8.3*    COAG Lab Results  Component Value Date   INR 1.04 04/17/2017   No results found for: PTT

## 2017-04-19 NOTE — Consult Note (Signed)
           [facility] Primary Care Navigator  04/19/2017  Andrew [name] [birth year] [id number]    Wentto seepatient at the bedsideto identify possible discharge needs but he was alreadydischarged.  Patient went home today per staffreport.  Primary care provider's office called Loma Sousa) to notify of patient's discharge and needfor post hospital follow-up and transition of care. Notified of health issues needing follow-up as well (mainly blood pressure monitoring and BP medication).  Made aware to refer patient to [facility] care management ifdeemed appropriateand necessary for further services.   For questions, please contact:  Dannielle Huh, BSN, RN- [facility] Primary Care Navigator  Telephone: [phone] [place]

## 2017-04-19 NOTE — OR Nursing (Signed)
Chart reviewed 04/19/2017 in order to complete VQI sheet for vascular surgery.

## 2017-04-20 ENCOUNTER — Ambulatory Visit: Payer: PPO | Admitting: Nurse Practitioner

## 2017-04-20 ENCOUNTER — Telehealth: Payer: Self-pay | Admitting: Vascular Surgery

## 2017-04-20 NOTE — Telephone Encounter (Signed)
Sched appt 05/05/17 at 8:30. Spoke to pt's wife.

## 2017-04-20 NOTE — Telephone Encounter (Signed)
-----   Message from Mena Goes, RN sent at 04/18/2017  8:19 AM EDT ----- Regarding: 2 weeks post CEA   ----- Message ----- From: Alvia Grove, PA-C Sent: 04/18/2017   8:00 AM To: Vvs Charge Pool  S/p left carotid endarterectomy 04/17/17  F/u with Dr. Bridgett Larsson in 2 weeks.   Thanks Maudie Mercury

## 2017-04-28 ENCOUNTER — Telehealth: Payer: Self-pay | Admitting: Vascular Surgery

## 2017-04-28 NOTE — Telephone Encounter (Signed)
-----   Message from Denman George, RN sent at 04/28/2017 10:38 AM EDT ----- Regarding: needs appt. changed to Dr. Lianne Moris schedule Dr. Donzetta Matters pointed out that this pt. Is on his schedule on 9/14, but it is Dr. Lianne Moris pt.  Can you change him to Dr. Lianne Moris schedule and notify him please?

## 2017-04-28 NOTE — Telephone Encounter (Signed)
Resched appt to [facility] sch on 05/05/17 at 9:00. Lm on hm#.

## 2017-05-02 ENCOUNTER — Ambulatory Visit (INDEPENDENT_AMBULATORY_CARE_PROVIDER_SITE_OTHER): Payer: PPO | Admitting: Adult Health

## 2017-05-02 ENCOUNTER — Encounter: Payer: Self-pay | Admitting: Adult Health

## 2017-05-02 VITALS — BP 122/62 | HR 98 | Ht 71.0 in | Wt 149.0 lb

## 2017-05-02 DIAGNOSIS — I779 Disorder of arteries and arterioles, unspecified: Secondary | ICD-10-CM

## 2017-05-02 DIAGNOSIS — I10 Essential (primary) hypertension: Secondary | ICD-10-CM | POA: Diagnosis not present

## 2017-05-02 DIAGNOSIS — I739 Peripheral vascular disease, unspecified: Principal | ICD-10-CM

## 2017-05-02 NOTE — Progress Notes (Deleted)
    Postoperative Visit   History of Present Illness   Andrew [name] is a 83 y.o. male who presents for postoperative follow-up for: left CEA fpr : asx carotid stenosis >90% (Date: 04/17/17).  The patient's neck incision is *** healed.  The patient has had *** stroke or TIA symptoms.   For VQI Use Only   PRE-ADM LIVING: {VQI Pre-admission Living:20973}  AMB STATUS: {VQI Ambulatory Status:20974}   Physical Examination  ***There were no vitals filed for this visit.  left Neck: Incision is *** healed  Neuro: CN 2-12 are intact *** except ***, Motor strength is ***/5 *** bilaterally, sensation is *** grossly intact   Medical Decision Making   Andrew [name] is a 83 y.o. male who presents s/p left CEA.   The patient's neck incision is *** healing with no stroke symptoms. I discussed in depth with the patient the nature of atherosclerosis, and emphasized the importance of maximal medical management including strict control of blood pressure, blood glucose, and lipid levels, obtaining regular exercise, anti-platelet use and cessation of smoking.   The patient is currently not on on statin as not medically indicated.  The patient is currently on an anti-platelet: ASA. The patient is aware that without maximal medical management the underlying atherosclerotic disease process will progress, limiting the benefit of any interventions. The patient's surveillance will included routine carotid duplex studies which will be completed in: 9 months, at which time the patient will be re-evaluated.   I emphasized the importance of routine surveillance of the carotid arteries as recurrence of stenosis is possible, especially with proper management of underlying atherosclerotic disease. The patient agrees to participate in their maximal medical care and routine surveillance.  Thank you for allowing [place] to participate in this patient's care.  Adele Barthel, MD, FACS [facility]  [place] Office: [phone] Pager: [phone]

## 2017-05-02 NOTE — Patient Instructions (Signed)
Medication Instructions:  Your physician recommends that you continue on your current medications as directed. Please refer to the Current Medication list given to you today.   Labwork: NONE   Testing/Procedures: NONE   Follow-Up: Your physician recommends that you schedule a follow-up appointment in: 3 Months    Any Other Special Instructions Will Be Listed Below (If Applicable).     If you need a refill on your cardiac medications before your next appointment, please call your pharmacy.  Thank you for choosing [facility]!   

## 2017-05-02 NOTE — Progress Notes (Signed)
Cardiology Office Note   Date:  05/02/2017   ID:  ARDEAN MELROY, DOB Dec 25, 1930, MRN [id number]  PCP:  Manon Hilding, MD  Cardiologist: Bronson Ing  Chief Complaint  Patient presents with  . PAD  . Hypertension   History of Present Illness: Andrew [name] is a 83 y.o. male who presents for ongoing assessment and management of peripheral arterial disease. The patient has RICA stenosis of 9-48%, LICA stenosis of 54-62%. Other history includes hypertension and tobacco abuse. The patient recently underwent endarterectomy of the left carotid artery with patch angioplasty by Dr.Chen August 2018. He is here for post hospitalization follow-up.    Past Medical History:  Diagnosis Date  . Arthritis   . BPH (benign prostatic hyperplasia)   . Carotid artery stenosis    left  . Hypertension     Past Surgical History:  Procedure Laterality Date  . CAROTID ENDARTERECTOMY Left 04/17/2017  . COLONOSCOPY N/A 01/19/2017   Procedure: COLONOSCOPY;  Surgeon: Daneil Dolin, MD;  [facility];  Service: Endoscopy;  Laterality: N/A;  1:15pm  . ENDARTERECTOMY Left 04/17/2017   Procedure: ENDARTERECTOMY CAROTID - LEFT;  Surgeon: Conrad Daleville, MD;  Location: [facility];  Service: Vascular;  Laterality: Left;  . ESOPHAGOGASTRODUODENOSCOPY N/A 01/19/2017   Procedure: ESOPHAGOGASTRODUODENOSCOPY (EGD);  Surgeon: Daneil Dolin, MD;  [facility];  Service: Endoscopy;  Laterality: N/A;  . PATCH ANGIOPLASTY Left 04/17/2017   Procedure: PATCH ANGIOPLASTY;  Surgeon: Conrad Wilkin, MD;  Location: [facility];  Service: Vascular;  Laterality: Left;     Current Outpatient Prescriptions  Medication Sig Dispense Refill  . acetaminophen (TYLENOL) 500 MG tablet Take 1,000 mg by mouth 2 (two) times daily as needed for moderate pain.    Marland Kitchen aspirin EC 81 MG tablet Take 1 tablet (81 mg total) by mouth daily.    Marland Kitchen HYDROcodone-acetaminophen (NORCO) 7.5-325 MG tablet Take 1 tablet by mouth 2 (two) times daily  as needed for moderate pain. 8 tablet 0  . losartan (COZAAR) 100 MG tablet Take 0.5 tablets (50 mg total) by mouth daily. Please do not take if systolic blood pressure is less than 140.    Marland Kitchen losartan (COZAAR) 25 MG tablet Take 1 tablet (25 mg total) by mouth daily. Please do not take if systolic blood pressure is less than 140 90 tablet 3  . metoprolol tartrate (LOPRESSOR) 25 MG tablet Take 1 tablet (25 mg total) by mouth daily. Please do not take if systolic blood pressure is less than 140.    . Multiple Vitamins-Minerals (ICAPS PO) Take 1 tablet by mouth daily.    . Omega-3 Fatty Acids (FISH OIL) 1200 MG CAPS Take 1,200 mg by mouth 2 (two) times daily.     No current facility-administered medications for this visit.     Allergies:   Patient has no known allergies.    Social History:  The patient  reports that he quit smoking about 38 years ago. He quit after 30.00 years of use. He has never used smokeless tobacco. He reports that he does not drink alcohol or use drugs.   Family History:  The patient's family history is not on file.    ROS: All other systems are reviewed and negative. Unless otherwise mentioned in H&P    PHYSICAL EXAM: VS:  BP 122/62   Pulse 98   Ht 5\' 11"  (1.803 m)   Wt 149 lb (67.6 kg)   SpO2 (!) 60%   BMI  20.78 kg/m  , BMI Body mass index is 20.78 kg/m. GEN: Well nourished, well developed, in no acute distress  HEENT: normal  Neck: no JVD, carotid bruits, or masses. Well healed left carotid endarterectomy incision. Evidence of dried blood but no infection evisceration  or bleeding.  Cardiac: RRR; no murmurs, rubs, or gallops,no edema  Respiratory:  Clear to auscultation bilaterally, normal work of breathing. Mild crackles in the bases cleared with cough GI: soft, nontender, nondistended, + BS MS: no deformity or atrophy  Skin: warm and dry, no rash Neuro:  Strength and sensation are intact Psych: euthymic mood, full affect   Recent Labs: 04/17/2017: ALT  17 04/18/2017: BUN 23; Creatinine, Ser 1.23; Hemoglobin 8.5; Platelets 160; Potassium 4.3; Sodium 136     Wt Readings from Last 3 Encounters:  05/02/17 149 lb (67.6 kg)  04/17/17 149 lb (67.6 kg)  04/14/17 149 lb 3.2 oz (67.7 kg)      Other studies Reviewed: NM Stress Test 04-16-2017 Study Result    There was no ST segment deviation noted during stress.  Defect 1: There is a medium defect of moderate severity present in the basal inferoseptal, basal inferior and mid inferior location. This appears to be due to soft tissue attenuation artifact.  This is a low risk study. No ischemic territories.  Nuclear stress EF: 52%.     ASSESSMENT AND PLAN:  1. Carotid artery disease: Status post left carotid endarterectomy by Dr.Chen. The patient is recovering well and this had no facial weakness, drooping, pain or dizziness. 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 He has not yet seen a vascular surgeon. He is due to see him at the end of the week. He is to continue current medication regimen.  2. Hypertension: Excellent control of blood pressure. The patient states that they are trying to wean him off beta blocker and has gone down from 50 mg daily to 25 mg daily. He remains on 75 mg total of losartan. Heart rate is elevated today. We'll not decrease metoprolol any further at this time. He is to see  Dr. Bronson Ing in 3 months at which time may decide to decrease a little further if his heart rate is well controlled.     Current medicines are reviewed at length with the patient today.    Labs/ tests ordered today include:  Phill Myron. [place] Pugh, ANP, AACC   05/02/2017 3:52 PM    [facility] [address] [place], [place] [address] Phone: [phone]; Fax: [phone]

## 2017-05-05 ENCOUNTER — Encounter: Payer: PPO | Admitting: Vascular Surgery

## 2017-05-12 ENCOUNTER — Ambulatory Visit: Payer: PPO | Admitting: Cardiovascular Disease

## 2017-05-20 DIAGNOSIS — D649 Anemia, unspecified: Secondary | ICD-10-CM | POA: Diagnosis not present

## 2017-05-20 DIAGNOSIS — I10 Essential (primary) hypertension: Secondary | ICD-10-CM | POA: Diagnosis not present

## 2017-05-20 DIAGNOSIS — E7800 Pure hypercholesterolemia, unspecified: Secondary | ICD-10-CM | POA: Diagnosis not present

## 2017-05-20 DIAGNOSIS — M1991 Primary osteoarthritis, unspecified site: Secondary | ICD-10-CM | POA: Diagnosis not present

## 2017-05-20 DIAGNOSIS — E782 Mixed hyperlipidemia: Secondary | ICD-10-CM | POA: Diagnosis not present

## 2017-05-20 DIAGNOSIS — R739 Hyperglycemia, unspecified: Secondary | ICD-10-CM | POA: Diagnosis not present

## 2017-05-23 DIAGNOSIS — N401 Enlarged prostate with lower urinary tract symptoms: Secondary | ICD-10-CM | POA: Diagnosis not present

## 2017-05-23 DIAGNOSIS — R5383 Other fatigue: Secondary | ICD-10-CM | POA: Diagnosis not present

## 2017-05-23 DIAGNOSIS — R972 Elevated prostate specific antigen [PSA]: Secondary | ICD-10-CM | POA: Diagnosis not present

## 2017-05-23 DIAGNOSIS — E782 Mixed hyperlipidemia: Secondary | ICD-10-CM | POA: Diagnosis not present

## 2017-05-23 DIAGNOSIS — I10 Essential (primary) hypertension: Secondary | ICD-10-CM | POA: Diagnosis not present

## 2017-05-23 DIAGNOSIS — R630 Anorexia: Secondary | ICD-10-CM | POA: Diagnosis not present

## 2017-05-23 DIAGNOSIS — D649 Anemia, unspecified: Secondary | ICD-10-CM | POA: Diagnosis not present

## 2017-05-23 DIAGNOSIS — R55 Syncope and collapse: Secondary | ICD-10-CM | POA: Diagnosis not present

## 2017-05-23 DIAGNOSIS — Z23 Encounter for immunization: Secondary | ICD-10-CM | POA: Diagnosis not present

## 2017-05-23 DIAGNOSIS — R7301 Impaired fasting glucose: Secondary | ICD-10-CM | POA: Diagnosis not present

## 2017-05-23 DIAGNOSIS — Z0001 Encounter for general adult medical examination with abnormal findings: Secondary | ICD-10-CM | POA: Diagnosis not present

## 2017-05-23 DIAGNOSIS — M1991 Primary osteoarthritis, unspecified site: Secondary | ICD-10-CM | POA: Diagnosis not present

## 2017-05-25 NOTE — Progress Notes (Signed)
    Postoperative Visit   History of Present Illness   Andrew [name] is a 83 y.o. male who presents for postoperative follow-up for: left CEA (Date: 04/17/17) for asx L ICA stenosis >90% .  The patient's neck incision is healed.  The patient has had no stroke or TIA symptoms.  Current Outpatient Prescriptions  Medication Sig Dispense Refill  . acetaminophen (TYLENOL) 500 MG tablet Take 1,000 mg by mouth 2 (two) times daily as needed for moderate pain.    Marland Kitchen aspirin EC 81 MG tablet Take 1 tablet (81 mg total) by mouth daily.    Marland Kitchen HYDROcodone-acetaminophen (NORCO) 7.5-325 MG tablet Take 1 tablet by mouth 2 (two) times daily as needed for moderate pain. 8 tablet 0  . losartan (COZAAR) 100 MG tablet Take 0.5 tablets (50 mg total) by mouth daily. Please do not take if systolic blood pressure is less than 140.    Marland Kitchen losartan (COZAAR) 25 MG tablet Take 1 tablet (25 mg total) by mouth daily. Please do not take if systolic blood pressure is less than 140 90 tablet 3  . metoprolol tartrate (LOPRESSOR) 25 MG tablet Take 1 tablet (25 mg total) by mouth daily. Please do not take if systolic blood pressure is less than 140.    . Multiple Vitamins-Minerals (ICAPS PO) Take 1 tablet by mouth daily.    . Omega-3 Fatty Acids (FISH OIL) 1200 MG CAPS Take 1,200 mg by mouth 2 (two) times daily.     No current facility-administered medications for this visit.     For VQI Use Only   PRE-ADM LIVING: Home  AMB STATUS: Ambulatory   Physical Examination   Vitals:   05/29/17 0855 05/29/17 0856  BP: (!) 158/72 (!) 155/65  Pulse: (!) 56   Resp: 20   Temp: (!) 97.4 F (36.3 C)   TempSrc: Oral   SpO2: 99%   Weight: 149 lb (67.6 kg)   Height: 5\' 11"  (1.803 m)     left Neck: Incision is healed  Neuro: CN 2-12 are intact , Motor strength is 5/5 bilaterally, sensation is grossly intact   Medical Decision Making   Andrew [name] is a 83 y.o. male who presents s/p left CEA for asx L ICA stenosis  >90%   The patient's neck incision is healing with no stroke symptoms. I discussed in depth with the patient the nature of atherosclerosis, and emphasized the importance of maximal medical management including strict control of blood pressure, blood glucose, and lipid levels, obtaining regular exercise, anti-platelet use and cessation of smoking.   The patient is currently not on on statin as not medically indicated.  The patient is currently on an anti-platelet: ASA. The patient is aware that without maximal medical management the underlying atherosclerotic disease process will progress, limiting the benefit of any interventions. The patient's surveillance will included routine carotid duplex studies which will be completed in: 9 months, at which time the patient will be re-evaluated.   I emphasized the importance of routine surveillance of the carotid arteries as recurrence of stenosis is possible, especially with proper management of underlying atherosclerotic disease. The patient agrees to participate in their maximal medical care and routine surveillance.  Thank you for allowing [place] to participate in this patient's care.  Adele Barthel, MD, FACS [facility] [place] Office: [phone] Pager: [phone]

## 2017-05-29 ENCOUNTER — Encounter: Payer: Self-pay | Admitting: Vascular Surgery

## 2017-05-29 ENCOUNTER — Ambulatory Visit (INDEPENDENT_AMBULATORY_CARE_PROVIDER_SITE_OTHER): Payer: Self-pay | Admitting: Vascular Surgery

## 2017-05-29 VITALS — BP 155/65 | HR 56 | Temp 97.4°F | Resp 20 | Ht 71.0 in | Wt 149.0 lb

## 2017-05-29 DIAGNOSIS — I6522 Occlusion and stenosis of left carotid artery: Secondary | ICD-10-CM

## 2017-06-06 NOTE — Addendum Note (Signed)
Addended by: Lianne Cure A on: 06/06/2017 04:27 PM   Modules accepted: Orders

## 2017-06-22 DIAGNOSIS — M542 Cervicalgia: Secondary | ICD-10-CM | POA: Diagnosis not present

## 2017-06-22 DIAGNOSIS — S0990XA Unspecified injury of head, initial encounter: Secondary | ICD-10-CM | POA: Diagnosis not present

## 2017-06-22 DIAGNOSIS — S12300A Unspecified displaced fracture of fourth cervical vertebra, initial encounter for closed fracture: Secondary | ICD-10-CM | POA: Diagnosis not present

## 2017-06-22 DIAGNOSIS — M459 Ankylosing spondylitis of unspecified sites in spine: Secondary | ICD-10-CM | POA: Diagnosis not present

## 2017-06-22 DIAGNOSIS — S199XXA Unspecified injury of neck, initial encounter: Secondary | ICD-10-CM | POA: Diagnosis not present

## 2017-06-22 DIAGNOSIS — W01198A Fall on same level from slipping, tripping and stumbling with subsequent striking against other object, initial encounter: Secondary | ICD-10-CM | POA: Diagnosis not present

## 2017-06-22 DIAGNOSIS — M549 Dorsalgia, unspecified: Secondary | ICD-10-CM | POA: Diagnosis not present

## 2017-06-23 DIAGNOSIS — R1314 Dysphagia, pharyngoesophageal phase: Secondary | ICD-10-CM | POA: Diagnosis not present

## 2017-06-23 DIAGNOSIS — J9811 Atelectasis: Secondary | ICD-10-CM | POA: Diagnosis not present

## 2017-06-23 DIAGNOSIS — R011 Cardiac murmur, unspecified: Secondary | ICD-10-CM | POA: Diagnosis not present

## 2017-06-23 DIAGNOSIS — M549 Dorsalgia, unspecified: Secondary | ICD-10-CM | POA: Diagnosis not present

## 2017-06-23 DIAGNOSIS — M8580 Other specified disorders of bone density and structure, unspecified site: Secondary | ICD-10-CM | POA: Diagnosis not present

## 2017-06-23 DIAGNOSIS — G8929 Other chronic pain: Secondary | ICD-10-CM | POA: Diagnosis not present

## 2017-06-23 DIAGNOSIS — M459 Ankylosing spondylitis of unspecified sites in spine: Secondary | ICD-10-CM | POA: Diagnosis not present

## 2017-06-23 DIAGNOSIS — R140 Abdominal distension (gaseous): Secondary | ICD-10-CM | POA: Diagnosis not present

## 2017-06-23 DIAGNOSIS — S32019A Unspecified fracture of first lumbar vertebra, initial encounter for closed fracture: Secondary | ICD-10-CM | POA: Diagnosis not present

## 2017-06-23 DIAGNOSIS — E871 Hypo-osmolality and hyponatremia: Secondary | ICD-10-CM | POA: Diagnosis not present

## 2017-06-23 DIAGNOSIS — S12490A Other displaced fracture of fifth cervical vertebra, initial encounter for closed fracture: Secondary | ICD-10-CM | POA: Diagnosis not present

## 2017-06-23 DIAGNOSIS — R633 Feeding difficulties: Secondary | ICD-10-CM | POA: Diagnosis not present

## 2017-06-23 DIAGNOSIS — R1312 Dysphagia, oropharyngeal phase: Secondary | ICD-10-CM | POA: Diagnosis not present

## 2017-06-23 DIAGNOSIS — I10 Essential (primary) hypertension: Secondary | ICD-10-CM | POA: Diagnosis not present

## 2017-06-23 DIAGNOSIS — R1313 Dysphagia, pharyngeal phase: Secondary | ICD-10-CM | POA: Diagnosis not present

## 2017-06-23 DIAGNOSIS — Z431 Encounter for attention to gastrostomy: Secondary | ICD-10-CM | POA: Diagnosis not present

## 2017-06-23 DIAGNOSIS — W19XXXS Unspecified fall, sequela: Secondary | ICD-10-CM | POA: Diagnosis not present

## 2017-06-23 DIAGNOSIS — M47816 Spondylosis without myelopathy or radiculopathy, lumbar region: Secondary | ICD-10-CM | POA: Diagnosis not present

## 2017-06-23 DIAGNOSIS — S32000A Wedge compression fracture of unspecified lumbar vertebra, initial encounter for closed fracture: Secondary | ICD-10-CM | POA: Diagnosis not present

## 2017-06-23 DIAGNOSIS — W19XXXA Unspecified fall, initial encounter: Secondary | ICD-10-CM | POA: Diagnosis not present

## 2017-06-23 DIAGNOSIS — M4312 Spondylolisthesis, cervical region: Secondary | ICD-10-CM | POA: Diagnosis not present

## 2017-06-23 DIAGNOSIS — S12300A Unspecified displaced fracture of fourth cervical vertebra, initial encounter for closed fracture: Secondary | ICD-10-CM | POA: Diagnosis not present

## 2017-06-23 DIAGNOSIS — M4186 Other forms of scoliosis, lumbar region: Secondary | ICD-10-CM | POA: Diagnosis not present

## 2017-06-23 DIAGNOSIS — Z87891 Personal history of nicotine dependence: Secondary | ICD-10-CM | POA: Diagnosis not present

## 2017-06-23 DIAGNOSIS — M5030 Other cervical disc degeneration, unspecified cervical region: Secondary | ICD-10-CM | POA: Diagnosis not present

## 2017-06-23 DIAGNOSIS — D649 Anemia, unspecified: Secondary | ICD-10-CM | POA: Diagnosis not present

## 2017-06-23 DIAGNOSIS — S32010A Wedge compression fracture of first lumbar vertebra, initial encounter for closed fracture: Secondary | ICD-10-CM | POA: Diagnosis not present

## 2017-06-23 DIAGNOSIS — R05 Cough: Secondary | ICD-10-CM | POA: Diagnosis not present

## 2017-06-23 DIAGNOSIS — S12390A Other displaced fracture of fourth cervical vertebra, initial encounter for closed fracture: Secondary | ICD-10-CM | POA: Diagnosis not present

## 2017-06-23 DIAGNOSIS — W01198A Fall on same level from slipping, tripping and stumbling with subsequent striking against other object, initial encounter: Secondary | ICD-10-CM | POA: Diagnosis not present

## 2017-06-23 DIAGNOSIS — E860 Dehydration: Secondary | ICD-10-CM | POA: Diagnosis not present

## 2017-06-23 DIAGNOSIS — M542 Cervicalgia: Secondary | ICD-10-CM | POA: Diagnosis not present

## 2017-06-23 DIAGNOSIS — I700 Atherosclerosis of aorta: Secondary | ICD-10-CM | POA: Diagnosis not present

## 2017-06-23 DIAGNOSIS — W1830XA Fall on same level, unspecified, initial encounter: Secondary | ICD-10-CM | POA: Diagnosis not present

## 2017-06-23 DIAGNOSIS — S32010D Wedge compression fracture of first lumbar vertebra, subsequent encounter for fracture with routine healing: Secondary | ICD-10-CM | POA: Diagnosis not present

## 2017-06-23 DIAGNOSIS — M519 Unspecified thoracic, thoracolumbar and lumbosacral intervertebral disc disorder: Secondary | ICD-10-CM | POA: Diagnosis not present

## 2017-06-23 DIAGNOSIS — M2578 Osteophyte, vertebrae: Secondary | ICD-10-CM | POA: Diagnosis not present

## 2017-06-23 DIAGNOSIS — D473 Essential (hemorrhagic) thrombocythemia: Secondary | ICD-10-CM | POA: Diagnosis not present

## 2017-06-23 DIAGNOSIS — S12400A Unspecified displaced fracture of fifth cervical vertebra, initial encounter for closed fracture: Secondary | ICD-10-CM | POA: Diagnosis not present

## 2017-06-23 DIAGNOSIS — S32018A Other fracture of first lumbar vertebra, initial encounter for closed fracture: Secondary | ICD-10-CM | POA: Diagnosis not present

## 2017-06-23 DIAGNOSIS — T07XXXA Unspecified multiple injuries, initial encounter: Secondary | ICD-10-CM | POA: Diagnosis not present

## 2017-07-10 DIAGNOSIS — S12401D Unspecified nondisplaced fracture of fifth cervical vertebra, subsequent encounter for fracture with routine healing: Secondary | ICD-10-CM | POA: Diagnosis not present

## 2017-07-10 DIAGNOSIS — S32010D Wedge compression fracture of first lumbar vertebra, subsequent encounter for fracture with routine healing: Secondary | ICD-10-CM | POA: Diagnosis not present

## 2017-07-10 DIAGNOSIS — R1312 Dysphagia, oropharyngeal phase: Secondary | ICD-10-CM | POA: Diagnosis not present

## 2017-07-10 DIAGNOSIS — I10 Essential (primary) hypertension: Secondary | ICD-10-CM | POA: Diagnosis not present

## 2017-07-10 DIAGNOSIS — S12301D Unspecified nondisplaced fracture of fourth cervical vertebra, subsequent encounter for fracture with routine healing: Secondary | ICD-10-CM | POA: Diagnosis not present

## 2017-07-10 DIAGNOSIS — W19XXXD Unspecified fall, subsequent encounter: Secondary | ICD-10-CM | POA: Diagnosis not present

## 2017-07-12 ENCOUNTER — Other Ambulatory Visit: Payer: Self-pay

## 2017-07-12 NOTE — Patient Outreach (Signed)
[facility]) Care Management  07/12/2017  Andrew [name] 25-Nov-1930 [id number]     Transition of care  Referral date: 07/12/17 Referral source: discharged from inpatient admission from [place] [facility] 07/07/17 Insurance: health team advantage  Telephone call to patient for transition of care follow up. HIPAA verified by patient. Discussed transition of care program with patient. Patient states he was admitted to the hospital due to a fall which injured his back and neck. Patient states he has to wear a neck collar for 6-8 weeks.  Patient states his activity is restricted by no bending twisting, or lifting anything  8lbs or greater.   Patient reports his pain is being managed with the oxycodone. Patient states he has a follow up appointment with his orthopedic doctor in December. Patient states he has not scheduled a follow up with his primary MD. RNCM explained to patient importance of  follow up visit with his primary doctor. Advised to schedule appointment. Patient verbalized understanding and agreement.  Patient reports he has home health therapy with [facility].  Patient states his services have started.  Patient denies having any additional symptoms or concerns. Patient refused ongoing transition of care calls. Patient states he is doing ok and has good family support.  RNCM advised patient to continue to take his medications as prescribed.  RNCM advised patient to keep his scheduled follow up appointments with his doctors.  RNCM advised patient to notify MD of any changes in condition prior to scheduled appointment. RNCM provided contact name and number: [phone] or main office number [phone] and 24 hour nurse advise line [phone].  RNCM verified patient aware of 911 services for urgent/ emergent needs.  ASSESSMENT; Patient refused ongoing transition of care calls.  PLAN:  RNCM will refer patient to care management assistant  to close due to refusal of services.  RNCM will send patient [facility] care management packet as discussed  RNCM will send notification to patients primary MD of closure.   Quinn Plowman RN,BSN,CCM [facility] Telephonic  [phone]

## 2017-07-18 DIAGNOSIS — I10 Essential (primary) hypertension: Secondary | ICD-10-CM | POA: Diagnosis not present

## 2017-07-18 DIAGNOSIS — S12401D Unspecified nondisplaced fracture of fifth cervical vertebra, subsequent encounter for fracture with routine healing: Secondary | ICD-10-CM | POA: Diagnosis not present

## 2017-07-18 DIAGNOSIS — S12301D Unspecified nondisplaced fracture of fourth cervical vertebra, subsequent encounter for fracture with routine healing: Secondary | ICD-10-CM | POA: Diagnosis not present

## 2017-07-18 DIAGNOSIS — W19XXXD Unspecified fall, subsequent encounter: Secondary | ICD-10-CM | POA: Diagnosis not present

## 2017-07-18 DIAGNOSIS — R1312 Dysphagia, oropharyngeal phase: Secondary | ICD-10-CM | POA: Diagnosis not present

## 2017-07-18 DIAGNOSIS — S32010D Wedge compression fracture of first lumbar vertebra, subsequent encounter for fracture with routine healing: Secondary | ICD-10-CM | POA: Diagnosis not present

## 2017-07-25 DIAGNOSIS — R1312 Dysphagia, oropharyngeal phase: Secondary | ICD-10-CM | POA: Diagnosis not present

## 2017-07-25 DIAGNOSIS — I10 Essential (primary) hypertension: Secondary | ICD-10-CM | POA: Diagnosis not present

## 2017-07-25 DIAGNOSIS — W19XXXD Unspecified fall, subsequent encounter: Secondary | ICD-10-CM | POA: Diagnosis not present

## 2017-07-25 DIAGNOSIS — S32010D Wedge compression fracture of first lumbar vertebra, subsequent encounter for fracture with routine healing: Secondary | ICD-10-CM | POA: Diagnosis not present

## 2017-07-25 DIAGNOSIS — S12401D Unspecified nondisplaced fracture of fifth cervical vertebra, subsequent encounter for fracture with routine healing: Secondary | ICD-10-CM | POA: Diagnosis not present

## 2017-07-25 DIAGNOSIS — S12301D Unspecified nondisplaced fracture of fourth cervical vertebra, subsequent encounter for fracture with routine healing: Secondary | ICD-10-CM | POA: Diagnosis not present

## 2017-08-01 ENCOUNTER — Ambulatory Visit: Payer: PPO | Admitting: Cardiovascular Disease

## 2017-08-03 ENCOUNTER — Ambulatory Visit: Payer: PPO | Admitting: Cardiovascular Disease

## 2017-08-07 DIAGNOSIS — S12391D Other nondisplaced fracture of fourth cervical vertebra, subsequent encounter for fracture with routine healing: Secondary | ICD-10-CM | POA: Diagnosis not present

## 2017-08-07 DIAGNOSIS — M452 Ankylosing spondylitis of cervical region: Secondary | ICD-10-CM | POA: Diagnosis not present

## 2017-08-07 DIAGNOSIS — I700 Atherosclerosis of aorta: Secondary | ICD-10-CM | POA: Diagnosis not present

## 2017-08-07 DIAGNOSIS — M5030 Other cervical disc degeneration, unspecified cervical region: Secondary | ICD-10-CM | POA: Diagnosis not present

## 2017-08-10 ENCOUNTER — Ambulatory Visit: Payer: PPO | Admitting: Cardiovascular Disease

## 2017-09-18 DIAGNOSIS — M4312 Spondylolisthesis, cervical region: Secondary | ICD-10-CM | POA: Diagnosis not present

## 2017-09-18 DIAGNOSIS — S12401D Unspecified nondisplaced fracture of fifth cervical vertebra, subsequent encounter for fracture with routine healing: Secondary | ICD-10-CM | POA: Diagnosis not present

## 2017-09-18 DIAGNOSIS — M452 Ankylosing spondylitis of cervical region: Secondary | ICD-10-CM | POA: Diagnosis not present

## 2017-09-18 DIAGNOSIS — M542 Cervicalgia: Secondary | ICD-10-CM | POA: Diagnosis not present

## 2017-09-18 DIAGNOSIS — M50821 Other cervical disc disorders at C4-C5 level: Secondary | ICD-10-CM | POA: Diagnosis not present

## 2017-09-18 DIAGNOSIS — S12391D Other nondisplaced fracture of fourth cervical vertebra, subsequent encounter for fracture with routine healing: Secondary | ICD-10-CM | POA: Diagnosis not present

## 2017-09-18 DIAGNOSIS — I700 Atherosclerosis of aorta: Secondary | ICD-10-CM | POA: Diagnosis not present

## 2017-09-18 DIAGNOSIS — M50021 Cervical disc disorder at C4-C5 level with myelopathy: Secondary | ICD-10-CM | POA: Diagnosis not present

## 2017-09-18 DIAGNOSIS — S12390A Other displaced fracture of fourth cervical vertebra, initial encounter for closed fracture: Secondary | ICD-10-CM | POA: Diagnosis not present

## 2017-09-22 DIAGNOSIS — R7301 Impaired fasting glucose: Secondary | ICD-10-CM | POA: Diagnosis not present

## 2017-09-22 DIAGNOSIS — I10 Essential (primary) hypertension: Secondary | ICD-10-CM | POA: Diagnosis not present

## 2017-09-22 DIAGNOSIS — N401 Enlarged prostate with lower urinary tract symptoms: Secondary | ICD-10-CM | POA: Diagnosis not present

## 2017-09-22 DIAGNOSIS — E7800 Pure hypercholesterolemia, unspecified: Secondary | ICD-10-CM | POA: Diagnosis not present

## 2017-09-22 DIAGNOSIS — E782 Mixed hyperlipidemia: Secondary | ICD-10-CM | POA: Diagnosis not present

## 2017-09-22 DIAGNOSIS — R634 Abnormal weight loss: Secondary | ICD-10-CM | POA: Diagnosis not present

## 2017-09-22 DIAGNOSIS — D649 Anemia, unspecified: Secondary | ICD-10-CM | POA: Diagnosis not present

## 2017-09-22 DIAGNOSIS — R739 Hyperglycemia, unspecified: Secondary | ICD-10-CM | POA: Diagnosis not present

## 2017-09-22 DIAGNOSIS — R5383 Other fatigue: Secondary | ICD-10-CM | POA: Diagnosis not present

## 2017-09-25 DIAGNOSIS — M1991 Primary osteoarthritis, unspecified site: Secondary | ICD-10-CM | POA: Diagnosis not present

## 2017-09-25 DIAGNOSIS — D649 Anemia, unspecified: Secondary | ICD-10-CM | POA: Diagnosis not present

## 2017-09-25 DIAGNOSIS — N401 Enlarged prostate with lower urinary tract symptoms: Secondary | ICD-10-CM | POA: Diagnosis not present

## 2017-09-25 DIAGNOSIS — R972 Elevated prostate specific antigen [PSA]: Secondary | ICD-10-CM | POA: Diagnosis not present

## 2017-09-25 DIAGNOSIS — Z6821 Body mass index (BMI) 21.0-21.9, adult: Secondary | ICD-10-CM | POA: Diagnosis not present

## 2017-09-25 DIAGNOSIS — R7301 Impaired fasting glucose: Secondary | ICD-10-CM | POA: Diagnosis not present

## 2017-09-25 DIAGNOSIS — E782 Mixed hyperlipidemia: Secondary | ICD-10-CM | POA: Diagnosis not present

## 2017-09-25 DIAGNOSIS — I10 Essential (primary) hypertension: Secondary | ICD-10-CM | POA: Diagnosis not present

## 2017-10-01 ENCOUNTER — Encounter (HOSPITAL_COMMUNITY): Payer: Self-pay | Admitting: Emergency Medicine

## 2017-10-01 ENCOUNTER — Other Ambulatory Visit: Payer: Self-pay

## 2017-10-01 ENCOUNTER — Emergency Department (HOSPITAL_COMMUNITY): Payer: PPO

## 2017-10-01 ENCOUNTER — Observation Stay (HOSPITAL_COMMUNITY)
Admission: EM | Admit: 2017-10-01 | Discharge: 2017-10-02 | Disposition: A | Discharge status: Home / Self Care | Payer: PPO | Attending: Internal Medicine | Admitting: Internal Medicine

## 2017-10-01 DIAGNOSIS — Z87891 Personal history of nicotine dependence: Secondary | ICD-10-CM | POA: Insufficient documentation

## 2017-10-01 DIAGNOSIS — Z79899 Other long term (current) drug therapy: Secondary | ICD-10-CM | POA: Diagnosis not present

## 2017-10-01 DIAGNOSIS — I739 Peripheral vascular disease, unspecified: Secondary | ICD-10-CM

## 2017-10-01 DIAGNOSIS — R42 Dizziness and giddiness: Secondary | ICD-10-CM | POA: Diagnosis not present

## 2017-10-01 DIAGNOSIS — I779 Disorder of arteries and arterioles, unspecified: Secondary | ICD-10-CM | POA: Diagnosis present

## 2017-10-01 DIAGNOSIS — I10 Essential (primary) hypertension: Secondary | ICD-10-CM | POA: Diagnosis not present

## 2017-10-01 DIAGNOSIS — R001 Bradycardia, unspecified: Secondary | ICD-10-CM | POA: Diagnosis not present

## 2017-10-01 DIAGNOSIS — R55 Syncope and collapse: Principal | ICD-10-CM | POA: Diagnosis present

## 2017-10-01 DIAGNOSIS — Z7982 Long term (current) use of aspirin: Secondary | ICD-10-CM | POA: Insufficient documentation

## 2017-10-01 DIAGNOSIS — I6522 Occlusion and stenosis of left carotid artery: Secondary | ICD-10-CM

## 2017-10-01 DIAGNOSIS — R404 Transient alteration of awareness: Secondary | ICD-10-CM | POA: Diagnosis not present

## 2017-10-01 DIAGNOSIS — R1111 Vomiting without nausea: Secondary | ICD-10-CM | POA: Diagnosis not present

## 2017-10-01 LAB — D-DIMER, QUANTITATIVE: D-Dimer, Quant: 2 ug/mL-FEU — ABNORMAL HIGH (ref 0.00–0.50)

## 2017-10-01 LAB — COMPREHENSIVE METABOLIC PANEL
ALT: 12 U/L — ABNORMAL LOW (ref 17–63)
AST: 19 U/L (ref 15–41)
Albumin: 3.7 g/dL (ref 3.5–5.0)
Alkaline Phosphatase: 72 U/L (ref 38–126)
Anion gap: 8 (ref 5–15)
BUN: 20 mg/dL (ref 6–20)
CO2: 24 mmol/L (ref 22–32)
Calcium: 8.7 mg/dL — ABNORMAL LOW (ref 8.9–10.3)
Chloride: 106 mmol/L (ref 101–111)
Creatinine, Ser: 0.99 mg/dL (ref 0.61–1.24)
GFR calc Af Amer: >60 mL/min (ref >60)
GFR calc non Af Amer: >60 mL/min (ref >60)
Glucose, Bld: 117 mg/dL — ABNORMAL HIGH (ref 65–99)
Potassium: 4.3 mmol/L (ref 3.5–5.1)
Sodium: 138 mmol/L (ref 135–145)
Total Bilirubin: 0.6 mg/dL (ref 0.3–1.2)
Total Protein: 6.5 g/dL (ref 6.5–8.1)

## 2017-10-01 LAB — MAGNESIUM: Magnesium: 2.3 mg/dL (ref 1.7–2.4)

## 2017-10-01 LAB — CBC WITH DIFFERENTIAL/PLATELET
Basophils Absolute: 0 10*3/uL (ref 0.0–0.1)
Basophils Relative: 1 %
Eosinophils Absolute: 0.1 10*3/uL (ref 0.0–0.7)
Eosinophils Relative: 2 %
HCT: 36.8 % — ABNORMAL LOW (ref 39.0–52.0)
Hemoglobin: 12.1 g/dL — ABNORMAL LOW (ref 13.0–17.0)
Lymphocytes Relative: 27 %
Lymphs Abs: 2.1 10*3/uL (ref 0.7–4.0)
MCH: 30.3 pg (ref 26.0–34.0)
MCHC: 32.9 g/dL (ref 30.0–36.0)
MCV: 92 fL (ref 78.0–100.0)
Monocytes Absolute: 0.9 10*3/uL (ref 0.1–1.0)
Monocytes Relative: 12 %
Neutro Abs: 4.6 10*3/uL (ref 1.7–7.7)
Neutrophils Relative %: 58 %
Platelets: 233 10*3/uL (ref 150–400)
RBC: 4 MIL/uL — ABNORMAL LOW (ref 4.22–5.81)
RDW: 13.8 % (ref 11.5–15.5)
WBC: 7.7 10*3/uL (ref 4.0–10.5)

## 2017-10-01 LAB — TSH: TSH: 0.674 u[IU]/mL (ref 0.350–4.500)

## 2017-10-01 LAB — TROPONIN I
Troponin I: <0.03 ng/mL (ref <0.03)
Troponin I: <0.03 ng/mL (ref <0.03)

## 2017-10-01 MED ORDER — ASPIRIN EC 81 MG PO TBEC
81.0000 mg | DELAYED_RELEASE_TABLET | Freq: Every day | ORAL | Status: DC
Start: 2017-10-01 — End: 2017-10-02
  Administered 2017-10-01 – 2017-10-02 (×2): 81 mg via ORAL
  Filled 2017-10-01 (×2): qty 1

## 2017-10-01 MED ORDER — SODIUM CHLORIDE 0.9% FLUSH
3.0000 mL | Freq: Two times a day (BID) | INTRAVENOUS | Status: DC
  Administered 2017-10-02: 3 mL via INTRAVENOUS

## 2017-10-01 MED ORDER — LOSARTAN POTASSIUM 50 MG PO TABS
75.0000 mg | ORAL_TABLET | Freq: Every day | ORAL | Status: DC
  Administered 2017-10-01 – 2017-10-02 (×2): 75 mg via ORAL
  Filled 2017-10-01 (×2): qty 2

## 2017-10-01 MED ORDER — ACETAMINOPHEN 650 MG RE SUPP: 650.0000 mg | Freq: Four times a day (QID) | RECTAL | Status: DC | PRN

## 2017-10-01 MED ORDER — ACETAMINOPHEN 325 MG PO TABS
650.0000 mg | ORAL_TABLET | Freq: Four times a day (QID) | ORAL | Status: DC | PRN
  Administered 2017-10-01 – 2017-10-02 (×2): 650 mg via ORAL
  Filled 2017-10-01 (×2): qty 2

## 2017-10-01 MED ORDER — SODIUM CHLORIDE 0.9 % IV SOLN
INTRAVENOUS | Status: DC
  Administered 2017-10-01 – 2017-10-02 (×2): via INTRAVENOUS

## 2017-10-01 MED ORDER — HYDROCODONE-ACETAMINOPHEN 7.5-325 MG PO TABS: 1.0000 | ORAL_TABLET | Freq: Two times a day (BID) | ORAL | Status: DC | PRN

## 2017-10-01 MED ORDER — ONDANSETRON HCL 4 MG/2ML IJ SOLN: 4.0000 mg | Freq: Four times a day (QID) | INTRAMUSCULAR | Status: DC | PRN

## 2017-10-01 MED ORDER — ENOXAPARIN SODIUM 40 MG/0.4ML ~~LOC~~ SOLN
40.0000 mg | SUBCUTANEOUS | Status: DC
  Administered 2017-10-01: 40 mg via SUBCUTANEOUS
  Filled 2017-10-01: qty 0.4

## 2017-10-01 MED ORDER — SODIUM CHLORIDE 0.9 % IV SOLN
INTRAVENOUS | Status: DC
  Administered 2017-10-01: 13:00:00 via INTRAVENOUS

## 2017-10-01 MED ORDER — ONDANSETRON HCL 4 MG PO TABS: 4.0000 mg | ORAL_TABLET | Freq: Four times a day (QID) | ORAL | Status: DC | PRN

## 2017-10-01 NOTE — ED Triage Notes (Signed)
Pt was sitting in church and a loud noise was heard and pt was found on the ground.  2 additional syncopal episodes witnessed by ems.  Pt had several episodes of bradycardia with associated hypotension and was given Atropine 0.5mg  by ems along with 1L of fluids.  Pt is alert and oriented on arrival to ED with no complaints.  Pt was vomiting on the way to ED.

## 2017-10-01 NOTE — H&P (Signed)
History and Physical    Andrew [name] TML:[id number] DOB: 1930-12-25 DOA: 10/01/2017  PCP: Manon Hilding, MD  Patient coming from: home  I have personally briefly reviewed patient's old medical records in [place]  Chief Complaint: passed out  HPI: Andrew [name] is a 84 y.o. male with medical history significant of hypertension, left carotid artery disease status post carotid endarterectomy in 03/2017, who presents to the hospital after passing out.  Patient was feeling fine this morning when he woke up.  He went to church morning where he suddenly became lightheaded, diaphoretic and passed out.  He is unaware of how long he had passed out for.  EMS was called and when his heart rate was checked and was found to be in the 30s-40s.  He had another episode of syncope at that time.  He received a dose of atropine by EMS and was brought to the ER for evaluation.  Denies any chest pain, shortness of breath.  He did have nausea and vomiting on route to the ER.  No headache, no unilateral weakness or numbness.  He is not had any fever cough, diarrhea recently.  He reports that his p.o. intake has been adequate..  ED Course: Troponin and basic labs were unrevealing.  Chest x-ray did not show any acute findings.  He is been referred for observation.  Review of Systems: As per HPI otherwise 10 point review of systems negative.    Past Medical History:  Diagnosis Date  . Arthritis   . BPH (benign prostatic hyperplasia)   . Carotid artery stenosis    left  . Hypertension     Past Surgical History:  Procedure Laterality Date  . CAROTID ENDARTERECTOMY Left 04/17/2017  . COLONOSCOPY N/A 01/19/2017   Procedure: COLONOSCOPY;  Surgeon: Daneil Dolin, MD;  [facility];  Service: Endoscopy;  Laterality: N/A;  1:15pm  . ENDARTERECTOMY Left 04/17/2017   Procedure: ENDARTERECTOMY CAROTID - LEFT;  Surgeon: Conrad Warren, MD;  Location: [place];  Service: Vascular;  Laterality: Left;  .  ESOPHAGOGASTRODUODENOSCOPY N/A 01/19/2017   Procedure: ESOPHAGOGASTRODUODENOSCOPY (EGD);  Surgeon: Daneil Dolin, MD;  [facility];  Service: Endoscopy;  Laterality: N/A;  . PATCH ANGIOPLASTY Left 04/17/2017   Procedure: PATCH ANGIOPLASTY;  Surgeon: Conrad Unionville, MD;  Location: [place];  Service: Vascular;  Laterality: Left;     reports that he quit smoking about 39 years ago. He quit after 30.00 years of use. he has never used smokeless tobacco. He reports that he does not drink alcohol or use drugs.  No Known Allergies  Family History  Problem Relation Age of Onset  . Colon cancer Neg Hx   . Colon polyps Neg Hx      Prior to Admission medications   Medication Sig Start Date End Date Taking? Authorizing Provider  HYDROcodone-acetaminophen (NORCO) 7.5-325 MG tablet Take 1 tablet by mouth 2 (two) times daily as needed for moderate pain. 04/18/17  Yes Alvia Grove, PA-C  LOSARTAN POTASSIUM PO Take 1 tablet by mouth daily. 09/20/17  Yes [provider]  metoprolol tartrate (LOPRESSOR) 25 MG tablet Take 1 tablet (25 mg total) by mouth daily. Please do not take if systolic blood pressure is less than 140. 04/18/17  Yes Trinh, Kimberly A, PA-C  Multiple Vitamins-Minerals (ICAPS PO) Take 1 tablet by mouth daily.   Yes [provider]  Omega-3 Fatty Acids (FISH OIL) 1200 MG CAPS Take 1,200 mg by  mouth 2 (two) times daily.   Yes [provider]  acetaminophen (TYLENOL) 500 MG tablet Take 1,000 mg by mouth 2 (two) times daily as needed for moderate pain.    [provider]  aspirin EC 81 MG tablet Take 1 tablet (81 mg total) by mouth daily. Patient not taking: Reported on 07/12/2017 04/18/17   Virgina Jock A, PA-C  losartan (COZAAR) 25 MG tablet Take 1 tablet (25 mg total) by mouth daily. Please do not take if systolic blood pressure is less than 140 Patient taking differently: Take 75 mg by mouth daily. Please do not take if systolic blood  pressure is less than 140 04/18/17 07/17/17  Alvia Grove, PA-C    Physical Exam: Vitals:   10/01/17 1315 10/01/17 1330 10/01/17 1400 10/01/17 1430  BP: (!) 155/75 (!) 162/82 (!) 157/68 (!) 139/59  Pulse: 71 71 65 60  Resp: 14 13 13 13   TempSrc:      SpO2: 100% 100% 100% 100%  Weight:      Height:        Constitutional: NAD, calm, comfortable Vitals:   10/01/17 1315 10/01/17 1330 10/01/17 1400 10/01/17 1430  BP: (!) 155/75 (!) 162/82 (!) 157/68 (!) 139/59  Pulse: 71 71 65 60  Resp: 14 13 13 13   TempSrc:      SpO2: 100% 100% 100% 100%  Weight:      Height:       Eyes: PERRL, lids and conjunctivae normal ENMT: Mucous membranes are moist. Posterior pharynx clear of any exudate or lesions.Normal dentition.  Neck: normal, supple, no masses, no thyromegaly Respiratory: clear to auscultation bilaterally, no wheezing, no crackles. Normal respiratory effort. No accessory muscle use.  Cardiovascular: Regular rate and rhythm, no murmurs / rubs / gallops. No extremity edema. 2+ pedal pulses. No carotid bruits.  Abdomen: no tenderness, no masses palpated. No hepatosplenomegaly. Bowel sounds positive.  Musculoskeletal: no clubbing / cyanosis. No joint deformity upper and lower extremities. Good ROM, no contractures. Normal muscle tone.  Skin: no rashes, lesions, ulcers. No induration Neurologic: CN 2-12 grossly intact. Sensation intact, DTR normal. Strength 5/5 in all 4.  Psychiatric: Normal judgment and insight. Alert and oriented x 3. Normal mood.   Labs on Admission: I have personally reviewed following labs and imaging studies  CBC: Recent Labs  Lab 10/01/17 1221  WBC 7.7  NEUTROABS 4.6  HGB 12.1*  HCT 36.8*  MCV 92.0  PLT 702   Basic Metabolic Panel: Recent Labs  Lab 10/01/17 1221 10/01/17 1227  NA 138  --   K 4.3  --   CL 106  --   CO2 24  --   GLUCOSE 117*  --   BUN 20  --   CREATININE 0.99  --   CALCIUM 8.7*  --   MG  --  2.3   GFR: Estimated Creatinine  Clearance: 46.5 mL/min (by C-G formula based on SCr of 0.99 mg/dL). Liver Function Tests: Recent Labs  Lab 10/01/17 1221  AST 19  ALT 12*  ALKPHOS 72  BILITOT 0.6  PROT 6.5  ALBUMIN 3.7   No results for input(s): LIPASE, AMYLASE in the last 168 hours. No results for input(s): AMMONIA in the last 168 hours. Coagulation Profile: No results for input(s): INR, PROTIME in the last 168 hours. Cardiac Enzymes: Recent Labs  Lab 10/01/17 1221  TROPONINI <0.03   BNP (last 3 results) No results for input(s): PROBNP in the last 8760 hours. HbA1C: No results for  input(s): HGBA1C in the last 72 hours. CBG: No results for input(s): GLUCAP in the last 168 hours. Lipid Profile: No results for input(s): CHOL, HDL, LDLCALC, TRIG, CHOLHDL, LDLDIRECT in the last 72 hours. Thyroid Function Tests: No results for input(s): TSH, T4TOTAL, FREET4, T3FREE, THYROIDAB in the last 72 hours. Anemia Panel: No results for input(s): VITAMINB12, FOLATE, FERRITIN, TIBC, IRON, RETICCTPCT in the last 72 hours. Urine analysis:    Component Value Date/Time   COLORURINE YELLOW 04/17/2017 0859   APPEARANCEUR CLEAR 04/17/2017 0859   LABSPEC 1.008 04/17/2017 0859   PHURINE 6.0 04/17/2017 0859   GLUCOSEU NEGATIVE 04/17/2017 0859   HGBUR NEGATIVE 04/17/2017 0859   BILIRUBINUR NEGATIVE 04/17/2017 0859   KETONESUR NEGATIVE 04/17/2017 0859   PROTEINUR NEGATIVE 04/17/2017 0859   NITRITE NEGATIVE 04/17/2017 0859   LEUKOCYTESUR NEGATIVE 04/17/2017 0859    Radiological Exams on Admission: Dg Chest 2 View  Result Date: 10/01/2017 CLINICAL DATA:  SYNCOPE, Pt was sitting in church and a loud noise was heard and pt was found on the ground. 2 additional syncopal episodes witnessed by ems. Pt had several episodes of bradycardia with associated hypotension PER ED NOTE PATI.*comment was truncated* EXAM: CHEST  2 VIEW COMPARISON:  None. FINDINGS: External pattern effect over the cardiac silhouette. Normal mediastinum and  cardiac silhouette. Normal pulmonary vasculature. No evidence of effusion, infiltrate, or pneumothorax. No acute bony abnormality. IMPRESSION: No acute cardiopulmonary process. Electronically Signed   By: Suzy Bouchard M.D.   On: 10/01/2017 13:28    EKG: Independently reviewed. Sinus rhythm without acute changes  Assessment/Plan Active Problems:   Left-sided carotid artery disease (HCC)   Syncope   HTN (hypertension)   Bradycardia    1. Syncope.  Suspect vasovagal.  He was noted to be bradycardic and is currently on metoprolol.  Reports that his doctor has been weaning down his metoprolol for several months now.  He did have an episode of syncope 3 years ago with similar presyncopal symptoms.  Will discontinue further beta-blockers.  Monitor heart rate on telemetry overnight.  Cycle cardiac markers.  Check echocardiogram.  Patient may benefit from event monitor.  Will consult cardiology. 2. Hypertension.  Continue on losartan.  May need adjustment since beta-blockers are being discontinued. 3. Left-sided carotid artery disease status post carotid endarterectomy in 03/2017 4. Bradycardia.  Suspect is related to beta-blockers.  Continue to monitor on telemetry.  DVT prophylaxis: lovenox Code Status: full code Family Communication: discussed with wife and daughter at the bedside Disposition Plan: discharge home once improved Consults called: cardiology Admission status: observation, tele   Kathie Dike MD [facility] Pager [phone]  If 7PM-7AM, please contact night-coverage [email or web address] Password [facility]  10/01/2017, 5:40 PM

## 2017-10-01 NOTE — ED Notes (Signed)
No pacemaker present  

## 2017-10-01 NOTE — ED Provider Notes (Signed)
[facility] EMERGENCY DEPARTMENT Provider Note   CSN: [id number] Arrival date & time: 10/01/17  1213     History   Chief Complaint Chief Complaint  Patient presents with  . Loss of Consciousness  . Bradycardia    HPI Andrew [name] is a 84 y.o. male.  HPI  Patient presents due to an episode of syncope. Patient was at church, in his usual state of health, when he had an episode of prodromal syncope. He recalls feeling lightheaded, without pain, and then was witnessed to collapse. EMS notes that on arrival the patient was diaphoretic, weak appearing, and soon after their initial assessment he had another episode of syncope. Subsequently, the patient was placed on monitors, received fluids, atropine, and was transported here for evaluation. Patient himself notes that he has been doing generally well since recovering from a recent cervical spine fracture. He had recent decrease in his metoprolol from 50 mg to 25 mg daily, otherwise no recent medication changes. He notes that he has a good appetite, has had no recent substantial illness. Currently he denies any pain, lightheadedness, weakness in any extremity. History provided by the patient and EMS on arrival.  Past Medical History:  Diagnosis Date  . Arthritis   . BPH (benign prostatic hyperplasia)   . Carotid artery stenosis    left  . Hypertension     Patient Active Problem List   Diagnosis Date Noted  . Syncope 10/01/2017  . Left-sided carotid artery disease ([place]) 04/17/2017  . Rectal bleeding 12/19/2016  . Anemia 12/19/2016  . Loss of weight 12/19/2016    Past Surgical History:  Procedure Laterality Date  . CAROTID ENDARTERECTOMY Left 04/17/2017  . COLONOSCOPY N/A 01/19/2017   Procedure: COLONOSCOPY;  Surgeon: Daneil Dolin, MD;  [facility];  Service: Endoscopy;  Laterality: N/A;  1:15pm  . ENDARTERECTOMY Left 04/17/2017   Procedure: ENDARTERECTOMY CAROTID - LEFT;  Surgeon: Conrad [place] Swan, MD;   Location: [place];  Service: Vascular;  Laterality: Left;  . ESOPHAGOGASTRODUODENOSCOPY N/A 01/19/2017   Procedure: ESOPHAGOGASTRODUODENOSCOPY (EGD);  Surgeon: Daneil Dolin, MD;  [facility];  Service: Endoscopy;  Laterality: N/A;  . PATCH ANGIOPLASTY Left 04/17/2017   Procedure: PATCH ANGIOPLASTY;  Surgeon: Conrad [place], MD;  [facility];  Service: Vascular;  Laterality: Left;       Home Medications    Prior to Admission medications   Medication Sig Start Date End Date Taking? Authorizing Provider  HYDROcodone-acetaminophen (NORCO) 7.5-325 MG tablet Take 1 tablet by mouth 2 (two) times daily as needed for moderate pain. 04/18/17  Yes Alvia Grove, PA-C  LOSARTAN POTASSIUM PO Take 1 tablet by mouth daily. 09/20/17  Yes [provider]  metoprolol tartrate (LOPRESSOR) 25 MG tablet Take 1 tablet (25 mg total) by mouth daily. Please do not take if systolic blood pressure is less than 140. 04/18/17  Yes Trinh, Kimberly A, PA-C  Multiple Vitamins-Minerals (ICAPS PO) Take 1 tablet by mouth daily.   Yes [provider]  Omega-3 Fatty Acids (FISH OIL) 1200 MG CAPS Take 1,200 mg by mouth 2 (two) times daily.   Yes [provider]  acetaminophen (TYLENOL) 500 MG tablet Take 1,000 mg by mouth 2 (two) times daily as needed for moderate pain.    [provider]  aspirin EC 81 MG tablet Take 1 tablet (81 mg total) by mouth daily. Patient not taking: Reported on 07/12/2017 04/18/17   Virgina Jock A, PA-C  losartan (  COZAAR) 25 MG tablet Take 1 tablet (25 mg total) by mouth daily. Please do not take if systolic blood pressure is less than 140 Patient taking differently: Take 75 mg by mouth daily. Please do not take if systolic blood pressure is less than 140 04/18/17 07/17/17  Alvia Grove, PA-C    Family History Family History  Problem Relation Age of Onset  . Colon cancer Neg Hx   . Colon polyps Neg Hx     Social History Social History    Tobacco Use  . Smoking status: Former Smoker    Years: 30.00    Last attempt to quit: 1980    Years since quitting: 39.1  . Smokeless tobacco: Never Used  Substance Use Topics  . Alcohol use: No  . Drug use: No     Allergies   Patient has no known allergies.   Review of Systems Review of Systems  Constitutional:       Per HPI, otherwise negative  HENT:       Per HPI, otherwise negative  Respiratory:       Per HPI, otherwise negative  Cardiovascular:       Per HPI, otherwise negative  Gastrointestinal: Negative for vomiting.  Endocrine:       Negative aside from HPI  Genitourinary:       Neg aside from HPI   Musculoskeletal:       Per HPI, otherwise negative  Skin: Negative.   Neurological: Positive for syncope and light-headedness.     Physical Exam Updated Vital Signs BP (!) 162/82   Pulse 71   Resp 13   Ht 5\' 4"  (1.626 m)   Wt 66.2 kg (146 lb)   SpO2 100%   BMI 25.06 kg/m   Physical Exam  Constitutional: He is oriented to person, place, and time. He has a sickly appearance.  HENT:  Head: Normocephalic and atraumatic.  Eyes: Conjunctivae and EOM are normal.  Cardiovascular: Normal rate and regular rhythm.  Murmur heard. Pulmonary/Chest: Effort normal. No stridor. No respiratory distress.  Abdominal: He exhibits no distension.  Musculoskeletal: He exhibits no edema.  Neurological: He is alert and oriented to person, place, and time.  Skin: Skin is warm and dry.  Psychiatric: He has a normal mood and affect.  Nursing note and vitals reviewed.    ED Treatments / Results  Labs (all labs ordered are listed, but only abnormal results are displayed) Labs Reviewed  COMPREHENSIVE METABOLIC PANEL - Abnormal; Notable for the following components:      Result Value   Glucose, Bld 117 (*)    Calcium 8.7 (*)    ALT 12 (*)    All other components within normal limits  CBC WITH DIFFERENTIAL/PLATELET - Abnormal; Notable for the following components:    RBC 4.00 (*)    Hemoglobin 12.1 (*)    HCT 36.8 (*)    All other components within normal limits  TROPONIN I  MAGNESIUM  CBG MONITORING, ED    EKG  EKG Interpretation  Date/Time:  Sunday October 01 2017 12:17:59 EST Ventricular Rate:  87 PR Interval:    QRS Duration: 87 QT Interval:  364 QTC Calculation: 438 R Axis:   80 Text Interpretation:  Sinus rhythm Artifact T wave abnormality Abnormal ekg Confirmed by Carmin Muskrat [phone]) on 10/01/2017 12:23:50 PM       Radiology Dg Chest 2 View  Result Date: 10/01/2017 CLINICAL DATA:  SYNCOPE, Pt was sitting in church and a loud  noise was heard and pt was found on the ground. 2 additional syncopal episodes witnessed by ems. Pt had several episodes of bradycardia with associated hypotension PER ED NOTE PATI.*comment was truncated* EXAM: CHEST  2 VIEW COMPARISON:  None. FINDINGS: External pattern effect over the cardiac silhouette. Normal mediastinum and cardiac silhouette. Normal pulmonary vasculature. No evidence of effusion, infiltrate, or pneumothorax. No acute bony abnormality. IMPRESSION: No acute cardiopulmonary process. Electronically Signed   By: Suzy Bouchard M.D.   On: 10/01/2017 13:28    Procedures Procedures (including critical care time)  Medications Ordered in ED Medications  0.9 %  sodium chloride infusion ( Intravenous New Bag/Given 10/01/17 1313)     Initial Impression / Assessment and Plan / ED Course  I have reviewed the triage vital signs and the nursing notes.  Pertinent labs & imaging results that were available during my care of the patient were reviewed by me and considered in my medical decision making (see chart for details).     1:45 PM Patient awake and alert, no additional episodes of syncope. I discussed the findings with family members and the patient, and suspicion of bradycardia, possibly medication related contributing to his episode. No evidence for pneumonia, PE, ACS, or sustained  neurologic dysfunction. With no ongoing pain, no sustained arrhythmia, patient is appropriate for admission for further monitoring, management given his episode of syncope, bradycardia, hypotension.   Final Clinical Impressions(s) / ED Diagnoses   Final diagnoses:  Syncope and collapse     Carmin Muskrat, MD 10/01/17 1346

## 2017-10-02 ENCOUNTER — Observation Stay (HOSPITAL_BASED_OUTPATIENT_CLINIC_OR_DEPARTMENT_OTHER): Payer: PPO

## 2017-10-02 ENCOUNTER — Observation Stay (HOSPITAL_COMMUNITY): Payer: PPO

## 2017-10-02 DIAGNOSIS — I700 Atherosclerosis of aorta: Secondary | ICD-10-CM | POA: Diagnosis not present

## 2017-10-02 DIAGNOSIS — I6522 Occlusion and stenosis of left carotid artery: Secondary | ICD-10-CM | POA: Diagnosis not present

## 2017-10-02 DIAGNOSIS — R55 Syncope and collapse: Secondary | ICD-10-CM | POA: Diagnosis not present

## 2017-10-02 DIAGNOSIS — R001 Bradycardia, unspecified: Secondary | ICD-10-CM | POA: Diagnosis not present

## 2017-10-02 DIAGNOSIS — I10 Essential (primary) hypertension: Secondary | ICD-10-CM

## 2017-10-02 LAB — BASIC METABOLIC PANEL
Anion gap: 9 (ref 5–15)
BUN: 17 mg/dL (ref 6–20)
CO2: 22 mmol/L (ref 22–32)
Calcium: 8.6 mg/dL — ABNORMAL LOW (ref 8.9–10.3)
Chloride: 107 mmol/L (ref 101–111)
Creatinine, Ser: 0.77 mg/dL (ref 0.61–1.24)
GFR calc Af Amer: >60 mL/min (ref >60)
GFR calc non Af Amer: >60 mL/min (ref >60)
Glucose, Bld: 104 mg/dL — ABNORMAL HIGH (ref 65–99)
Potassium: 3.9 mmol/L (ref 3.5–5.1)
Sodium: 138 mmol/L (ref 135–145)

## 2017-10-02 LAB — CBC
HCT: 33.4 % — ABNORMAL LOW (ref 39.0–52.0)
Hemoglobin: 11 g/dL — ABNORMAL LOW (ref 13.0–17.0)
MCH: 30.4 pg (ref 26.0–34.0)
MCHC: 32.9 g/dL (ref 30.0–36.0)
MCV: 92.3 fL (ref 78.0–100.0)
Platelets: 217 10*3/uL (ref 150–400)
RBC: 3.62 MIL/uL — ABNORMAL LOW (ref 4.22–5.81)
RDW: 13.9 % (ref 11.5–15.5)
WBC: 10.5 10*3/uL (ref 4.0–10.5)

## 2017-10-02 LAB — TROPONIN I
Troponin I: <0.03 ng/mL (ref <0.03)
Troponin I: <0.03 ng/mL (ref <0.03)

## 2017-10-02 LAB — ECHOCARDIOGRAM COMPLETE
Height: 64 in
Weight: 2338.64 oz

## 2017-10-02 LAB — GLUCOSE, CAPILLARY
Glucose-Capillary: 108 mg/dL — ABNORMAL HIGH (ref 65–99)
Glucose-Capillary: 93 mg/dL (ref 65–99)

## 2017-10-02 MED ORDER — IOPAMIDOL (ISOVUE-370) INJECTION 76%
100.0000 mL | Freq: Once | INTRAVENOUS | Status: AC | PRN
  Administered 2017-10-02: 100 mL via INTRAVENOUS

## 2017-10-02 MED ORDER — LOSARTAN POTASSIUM 25 MG PO TABS: 75.0000 mg | ORAL_TABLET | Freq: Every day | ORAL | Status: DC

## 2017-10-02 NOTE — Discharge Summary (Signed)
Physician Discharge Summary  Andrew [name] XBM:[id number] DOB: November 11, 1930 DOA: 10/01/2017  PCP: Manon Hilding, MD  Admit date: 10/01/2017 Discharge date: 10/02/2017  Admitted From: Home Disposition: Home  Recommendations for Outpatient Follow-up:  1. Follow up with PCP in 1-2 weeks 2. Please obtain BMP/CBC in one week 3. Follow-up with cardiology for event monitor review  Home Health: Equipment/Devices:  Discharge Condition: Stable CODE STATUS: Full code Diet recommendation: Heart Healthy   Discharge Diagnoses:  Active Problems:   Left-sided carotid artery disease (HCC)   Syncope   HTN (hypertension)   Bradycardia  1. Syncope.  Suspect vasovagal.  He was noted to be bradycardic and was on metoprolol.  Reports that his doctor had been weaning down his metoprolol for several months now.  He did have an episode of syncope 3 years ago with similar presyncopal symptoms.    Cardiac markers were negative..  Echocardiogram did not show any acute findings.  Discussed with Dr. Harl Bowie and was not felt the patient will need further inpatient workup.  He agreed with outpatient event monitor and will have his office arrange this as well as outpatient follow-up.  D-dimer was mildly elevated, but CT angios negative for pulmonary embolus.  Patient is asymptomatic at this time and is eager to discharge home. 2. Hypertension.  Continue on losartan.  May need adjustment since beta-blockers are being discontinued.  Follow-up as outpatient 3. Left-sided carotid artery disease status post carotid endarterectomy in 03/2017 4. Bradycardia.  Suspect is related to beta-blockers.  Continue to monitor on telemetry.  Discharge Instructions  Discharge Instructions    Diet - low sodium heart healthy   Complete by:  As directed    Increase activity slowly   Complete by:  As directed      Allergies as of 10/02/2017   No Known Allergies     Medication List    STOP taking these medications   metoprolol  tartrate 25 MG tablet Commonly known as:  LOPRESSOR     TAKE these medications   acetaminophen 500 MG tablet Commonly known as:  TYLENOL Take 1,000 mg by mouth 2 (two) times daily as needed for moderate pain.   aspirin EC 81 MG tablet Take 1 tablet (81 mg total) by mouth daily.   Fish Oil 1200 MG Caps Take 1,200 mg by mouth 2 (two) times daily.   HYDROcodone-acetaminophen 7.5-325 MG tablet Commonly known as:  NORCO Take 1 tablet by mouth 2 (two) times daily as needed for moderate pain.   ICAPS PO Take 1 tablet by mouth daily.   losartan 25 MG tablet Commonly known as:  COZAAR Take 3 tablets (75 mg total) by mouth daily. Please do not take if systolic blood pressure is less than 140 What changed:    how much to take  Another medication with the same name was removed. Continue taking this medication, and follow the directions you see here.       No Known Allergies  Consultations:     Procedures/Studies: Dg Chest 2 View  Result Date: 10/01/2017 CLINICAL DATA:  SYNCOPE, Pt was sitting in church and a loud noise was heard and pt was found on the ground. 2 additional syncopal episodes witnessed by ems. Pt had several episodes of bradycardia with associated hypotension PER ED NOTE PATI.*comment was truncated* EXAM: CHEST  2 VIEW COMPARISON:  None. FINDINGS: External pattern effect over the cardiac silhouette. Normal mediastinum and cardiac silhouette. Normal pulmonary vasculature. No evidence of effusion, infiltrate, or pneumothorax.  No acute bony abnormality. IMPRESSION: No acute cardiopulmonary process. Electronically Signed   By: Suzy Bouchard M.D.   On: 10/01/2017 13:28   Ct Angio Chest Pe W Or Wo Contrast  Result Date: 10/02/2017 CLINICAL DATA:  Multiple syncopal episodes. Evaluate for pulmonary embolism. EXAM: CT ANGIOGRAPHY CHEST WITH CONTRAST TECHNIQUE: Multidetector CT imaging of the chest was performed using the standard protocol during bolus administration of  intravenous contrast. Multiplanar CT image reconstructions and MIPs were obtained to evaluate the vascular anatomy. CONTRAST:  151mL ISOVUE-370 IOPAMIDOL (ISOVUE-370) INJECTION 76% COMPARISON:  Chest x-ray 10/01/2017 and lumbar spine CT 06/22/2017 FINDINGS: Cardiovascular: Heart is normal size. Minimal calcified plaque over the left anterior descending and right coronary arteries. Calcified plaque over the thoracic aorta. No evidence of pulmonary embolism. Mediastinum/Nodes: No hilar or mediastinal adenopathy. Remaining mediastinal structures are within normal. Lungs/Pleura: Lungs are well inflated without focal airspace consolidation or effusion. No evidence of pneumothorax. Mild biapical pleural thickening and pleural based scarring over the lateral left apex. Airways are normal. Upper Abdomen: No acute findings. Musculoskeletal: Degenerative changes of the spine. Known L1 compression fracture slightly worse. Review of the MIP images confirms the above findings. IMPRESSION: No evidence of pulmonary embolism. No acute cardiopulmonary disease. Slight interval worsening mild L1 compression fracture. Mild atherosclerotic coronary artery disease. Aortic Atherosclerosis (ICD10-I70.0). Electronically Signed   By: Marin Olp M.D.   On: 10/02/2017 10:36    Echo: - Left ventricle: The cavity size was normal. Wall thickness was   normal. Systolic function was normal. The estimated ejection   fraction was in the range of 60% to 65%. Wall motion was normal;   there were no regional wall motion abnormalities. Left   ventricular diastolic function parameters were normal. - Aortic valve: Mildly calcified annulus. Trileaflet; normal   thickness leaflets. Valve area (VTI): 2.86 cm^2. Valve area   (Vmax): 2.42 cm^2.   Subjective: Feeling better.  No chest pain or shortness of breath.  No dizziness or lightheadedness.  Discharge Exam: Vitals:   10/02/17 0849 10/02/17 1332  BP: 129/78 (!) 154/59  Pulse: 81 76   Resp: 20 19  Temp: 98 F (36.7 C) 98.5 F (36.9 C)  SpO2: 100% 100%   Vitals:   10/02/17 0449 10/02/17 0838 10/02/17 0849 10/02/17 1332  BP: (!) 133/52 (!) 148/74 129/78 (!) 154/59  Pulse: 65 78 81 76  Resp: 16  20 19   Temp: 98.1 F (36.7 C)  98 F (36.7 C) 98.5 F (36.9 C)  TempSrc: Oral  Oral Oral  SpO2: 99%  100% 100%  Weight: 66.3 kg (146 lb 2.6 oz)     Height:        General: Pt is alert, awake, not in acute distress Cardiovascular: RRR, S1/S2 +, no rubs, no gallops Respiratory: CTA bilaterally, no wheezing, no rhonchi Abdominal: Soft, NT, ND, bowel sounds + Extremities: no edema, no cyanosis    The results of significant diagnostics from this hospitalization (including imaging, microbiology, ancillary and laboratory) are listed below for reference.     Microbiology: No results found for this or any previous visit (from the past 240 hour(s)).   Labs: BNP (last 3 results) No results for input(s): BNP in the last 8760 hours. Basic Metabolic Panel: Recent Labs  Lab 10/01/17 1221 10/01/17 1227 10/02/17 0323  NA 138  --  138  K 4.3  --  3.9  CL 106  --  107  CO2 24  --  22  GLUCOSE 117*  --  104*  BUN 20  --  17  CREATININE 0.99  --  0.77  CALCIUM 8.7*  --  8.6*  MG  --  2.3  --    Liver Function Tests: Recent Labs  Lab 10/01/17 1221  AST 19  ALT 12*  ALKPHOS 72  BILITOT 0.6  PROT 6.5  ALBUMIN 3.7   No results for input(s): LIPASE, AMYLASE in the last 168 hours. No results for input(s): AMMONIA in the last 168 hours. CBC: Recent Labs  Lab 10/01/17 1221 10/02/17 0517  WBC 7.7 10.5  NEUTROABS 4.6  --   HGB 12.1* 11.0*  HCT 36.8* 33.4*  MCV 92.0 92.3  PLT 233 217   Cardiac Enzymes: Recent Labs  Lab 10/01/17 1221 10/01/17 1926 10/02/17 0323 10/02/17 0900  TROPONINI <0.03 <0.03 <0.03 <0.03   BNP: Invalid input(s): POCBNP CBG: Recent Labs  Lab 10/01/17 1219 10/02/17 0751  GLUCAP 108* 93   D-Dimer Recent Labs     10/01/17 1926  DDIMER 2.00*   Hgb A1c No results for input(s): HGBA1C in the last 72 hours. Lipid Profile No results for input(s): CHOL, HDL, LDLCALC, TRIG, CHOLHDL, LDLDIRECT in the last 72 hours. Thyroid function studies Recent Labs    10/01/17 1927  TSH 0.674   Anemia work up No results for input(s): VITAMINB12, FOLATE, FERRITIN, TIBC, IRON, RETICCTPCT in the last 72 hours. Urinalysis    Component Value Date/Time   COLORURINE YELLOW 04/17/2017 0859   APPEARANCEUR CLEAR 04/17/2017 0859   LABSPEC 1.008 04/17/2017 0859   PHURINE 6.0 04/17/2017 0859   GLUCOSEU NEGATIVE 04/17/2017 0859   HGBUR NEGATIVE 04/17/2017 0859   BILIRUBINUR NEGATIVE 04/17/2017 0859   KETONESUR NEGATIVE 04/17/2017 0859   PROTEINUR NEGATIVE 04/17/2017 0859   NITRITE NEGATIVE 04/17/2017 0859   LEUKOCYTESUR NEGATIVE 04/17/2017 0859   Sepsis Labs Invalid input(s): PROCALCITONIN,  WBC,  LACTICIDVEN Microbiology No results found for this or any previous visit (from the past 240 hour(s)).   Time coordinating discharge: Over 30 minutes  SIGNED:   Kathie Dike, MD  [facility] 10/02/2017, 2:36 PM Pager   If 7PM-7AM, please contact night-coverage [email or web address] Password [email or web address]

## 2017-10-02 NOTE — Care Management Obs Status (Signed)
[facility] NOTIFICATION   Patient Details  Name: HERMENEGILDO CLAUSEN MRN: [id number] Date of Birth: 1931/05/17   Medicare Observation Status Notification Given:  Yes    Tracy Kinner, Chauncey Reading, RN 10/02/2017, 10:59 AM

## 2017-10-02 NOTE — Progress Notes (Signed)
*  PRELIMINARY RESULTS* Echocardiogram 2D Echocardiogram has been performed.  Leavy Cella 10/02/2017, 11:25 AM

## 2017-10-03 ENCOUNTER — Telehealth: Payer: Self-pay | Admitting: Cardiovascular Disease

## 2017-10-03 ENCOUNTER — Telehealth: Payer: Self-pay | Admitting: *Deleted

## 2017-10-03 NOTE — Telephone Encounter (Signed)
Pt notified that event monitor would be shipped to his home.

## 2017-10-03 NOTE — Telephone Encounter (Signed)
Please give pt's daughter a call concerning a monitor --can be reached @ [phone]

## 2017-10-03 NOTE — Telephone Encounter (Signed)
Attempt to reach, voicemail full-cc

## 2017-10-10 DIAGNOSIS — R55 Syncope and collapse: Secondary | ICD-10-CM | POA: Diagnosis not present

## 2017-10-10 DIAGNOSIS — Z6821 Body mass index (BMI) 21.0-21.9, adult: Secondary | ICD-10-CM | POA: Diagnosis not present

## 2017-10-10 DIAGNOSIS — I10 Essential (primary) hypertension: Secondary | ICD-10-CM | POA: Diagnosis not present

## 2017-10-10 NOTE — Telephone Encounter (Signed)
Pt received call from [employer] to verify address.He has not received monitor, will call company to inquire where monitor is

## 2017-10-12 ENCOUNTER — Telehealth: Payer: Self-pay | Admitting: Cardiovascular Disease

## 2017-10-12 NOTE — Telephone Encounter (Signed)
Patient stated he thought a physician had to place monitor. Advised patient he could place the monitor on himself but if he had any difficulty or questions we would be happy to assist. Martin Majestic over monitor instructions with patient as well.

## 2017-10-12 NOTE — Telephone Encounter (Signed)
Andrew [name] called stating that he has received his heart monitor. States that he cannot put it on himself.

## 2017-10-14 ENCOUNTER — Ambulatory Visit (INDEPENDENT_AMBULATORY_CARE_PROVIDER_SITE_OTHER): Payer: PPO

## 2017-10-14 DIAGNOSIS — R55 Syncope and collapse: Secondary | ICD-10-CM | POA: Diagnosis not present

## 2017-10-16 ENCOUNTER — Other Ambulatory Visit: Payer: Self-pay

## 2017-10-16 DIAGNOSIS — R55 Syncope and collapse: Secondary | ICD-10-CM

## 2017-11-06 NOTE — Progress Notes (Signed)
Cardiology Office Note    Date:  11/07/2017   ID:  Andrew [name], DOB September 18, 1930, MRN [id number]  PCP:  Manon Hilding, MD  Cardiologist: Kate Sable, MD    Chief Complaint  Patient presents with  . Hospitalization Follow-up    History of Present Illness:    Andrew [name] is a 84 y.o. male with past medical history of PAD, carotid artery stenosis (s/p L CEA in 03/2017), HTN, and prior tobacco use who presents to the office today for hospital follow-up.   He was last examined by Jory Sims, DNP in 04/2017 and denied any recent complications following his recent CEA. He was continued on his current medication regimen. In the interim, he was admitted to [facility] from 2/10 - 10/02/2017 for evaluation of syncope. Reported passing out while at church and HR was in the 30's to 40's upon EMS arrival. His Lopressor was discontinued upon admission. Cyclic troponin values remained negative and his echo showed a preserved EF of 60-65% with no regional WMA. CTA was negative for a PE. An event monitor was ordered as an outpatient and showed NSR with episodes of sinus tachycardia and occasional PAC's but no significant arrhythmias.    In talking with the patient today, he reports overall doing well since his recent hospitalization. He denies any repeat syncopal episodes since hospital discharge. No recent chest pain, dyspnea on exertion, orthopnea, PND, lower extremity edema, palpitations, or presyncope.  He has been following his blood pressure closely at home due to Lopressor being discontinued during his hospitalization. His PCP increased his Losartan from 75 mg daily to 100 mg daily but the patient noted his SBP was still in the 170's during the morning hours, therefore he self-titrated this to 125 mg in the morning and 25 mg at night for the past few days. Heart rate has been well controlled in the 70's to 90's.   Past Medical History:  Diagnosis Date  . Arthritis   . BPH (benign  prostatic hyperplasia)   . Carotid artery stenosis    a. s/p L CEA in 03/2017  . Hypertension     Past Surgical History:  Procedure Laterality Date  . CAROTID ENDARTERECTOMY Left 04/17/2017  . COLONOSCOPY N/A 01/19/2017   Procedure: COLONOSCOPY;  Surgeon: Daneil Dolin, MD;  [facility];  Service: Endoscopy;  Laterality: N/A;  1:15pm  . ENDARTERECTOMY Left 04/17/2017   Procedure: ENDARTERECTOMY CAROTID - LEFT;  Surgeon: Conrad Ontario, MD;  Location: [place];  Service: Vascular;  Laterality: Left;  . ESOPHAGOGASTRODUODENOSCOPY N/A 01/19/2017   Procedure: ESOPHAGOGASTRODUODENOSCOPY (EGD);  Surgeon: Daneil Dolin, MD;  [facility];  Service: Endoscopy;  Laterality: N/A;  . PATCH ANGIOPLASTY Left 04/17/2017   Procedure: PATCH ANGIOPLASTY;  Surgeon: Conrad Englewood, MD;  Location: [facility];  Service: Vascular;  Laterality: Left;    Current Medications: Outpatient Medications Prior to Visit  Medication Sig Dispense Refill  . acetaminophen (TYLENOL) 500 MG tablet Take 1,000 mg by mouth 2 (two) times daily as needed for moderate pain.    Marland Kitchen HYDROcodone-acetaminophen (NORCO) 7.5-325 MG tablet Take 1 tablet by mouth 2 (two) times daily as needed for moderate pain. 8 tablet 0  . losartan (COZAAR) 100 MG tablet     . losartan (COZAAR) 25 MG tablet Take 3 tablets (75 mg total) by mouth daily. Please do not take if systolic blood pressure is less than 140 (Patient taking differently: Take 25 mg by mouth  2 (two) times daily. Please do not take if systolic blood pressure is less than 140)    . losartan (COZAAR) 25 MG tablet Take 25 mg by mouth 2 (two) times daily. Taking with 100 mg Losartan ( Pt takes 125 mg in the AM and takes 25 mg in the PM)    . aspirin EC 81 MG tablet Take 1 tablet (81 mg total) by mouth daily. (Patient not taking: Reported on 07/12/2017)    . Multiple Vitamins-Minerals (ICAPS PO) Take 1 tablet by mouth daily.    . Omega-3 Fatty Acids (FISH OIL) 1200 MG CAPS Take  1,200 mg by mouth 2 (two) times daily.     No facility-administered medications prior to visit.      Allergies:   Patient has no known allergies.   Social History   Socioeconomic History  . Marital status: Married    Spouse name: None  . Number of children: None  . Years of education: None  . Highest education level: None  Social Needs  . Financial resource strain: None  . Food insecurity - worry: None  . Food insecurity - inability: None  . Transportation needs - medical: None  . Transportation needs - non-medical: None  Occupational History  . None  Tobacco Use  . Smoking status: Former Smoker    Years: 30.00    Last attempt to quit: 1980    Years since quitting: 39.2  . Smokeless tobacco: Never Used  Substance and Sexual Activity  . Alcohol use: No  . Drug use: No  . Sexual activity: No  Other Topics Concern  . None  Social History Narrative  . None     Family History:  The patient's family history includes Hypertension in his father.   Review of Systems:   Please see the history of present illness.     General:  No chills, fever, night sweats or weight changes. Positive for recent syncope.  Cardiovascular:  No chest pain, dyspnea on exertion, edema, orthopnea, palpitations, paroxysmal nocturnal dyspnea. Dermatological: No rash, lesions/masses Respiratory: No cough, dyspnea Urologic: No hematuria, dysuria Abdominal:   No nausea, vomiting, diarrhea, bright red blood per rectum, melena, or hematemesis Neurologic:  No visual changes, wkns, changes in mental status. All other systems reviewed and are otherwise negative except as noted above.   Physical Exam:    VS:  BP 132/60   Pulse 89   Ht 5\' 11"  (1.803 m)   Wt 149 lb (67.6 kg)   SpO2 97%   BMI 20.78 kg/m    General: Well developed, well nourished Caucasian male appearing in no acute distress. Head: Normocephalic, atraumatic, sclera non-icteric, no xanthomas, nares are without discharge.  Neck: No  carotid bruits. JVD not elevated.  Lungs: Respirations regular and unlabored, without wheezes or rales.  Heart: Regular rate and rhythm. No S3 or S4.  No murmur, no rubs, or gallops appreciated. Abdomen: Soft, non-tender, non-distended with normoactive bowel sounds. No hepatomegaly. No rebound/guarding. No obvious abdominal masses. Msk:  Strength and tone appear normal for age. No joint deformities or effusions. Extremities: No clubbing or cyanosis. No lower extremity edema.  Distal pedal pulses are 2+ bilaterally. Neuro: Alert and oriented X 3. Moves all extremities spontaneously. No focal deficits noted. Psych:  Responds to questions appropriately with a normal affect. Skin: No rashes or lesions noted  Wt Readings from Last 3 Encounters:  11/07/17 149 lb (67.6 kg)  10/02/17 146 lb 2.6 oz (66.3 kg)  05/29/17 149 lb (  67.6 kg)     Studies/Labs Reviewed:   EKG:  EKG is not ordered today.    Recent Labs: 10/01/2017: ALT 12; Magnesium 2.3; TSH 0.674 10/02/2017: BUN 17; Creatinine, Ser 0.77; Hemoglobin 11.0; Platelets 217; Potassium 3.9; Sodium 138   Lipid Panel No results found for: CHOL, TRIG, HDL, CHOLHDL, VLDL, LDLCALC, LDLDIRECT  Additional studies/ records that were reviewed today include:   Echocardiogram: 10/02/2017 Study Conclusions  - Left ventricle: The cavity size was normal. Wall thickness was   normal. Systolic function was normal. The estimated ejection   fraction was in the range of 60% to 65%. Wall motion was normal;   there were no regional wall motion abnormalities. Left   ventricular diastolic function parameters were normal. - Aortic valve: Mildly calcified annulus. Trileaflet; normal   thickness leaflets. Valve area (VTI): 2.86 cm^2. Valve area   (Vmax): 2.42 cm^2. - Technically adequate study.   Event Monitor: 09/2017  14 day event monitor  No symptoms reported  Telemetry tracings show sinus rhythm and sinus tachycardia  No significant  arrhythmias   Assessment:    1. Syncope, unspecified syncope type   2. Essential hypertension   3. Stenosis of carotid artery, unspecified laterality      Plan:   In order of problems listed above:  1. Sycnope - recently admitted following a syncopal event while at church which occurred while standing. Felt diaphoretic prior to the episode. HR was in the 30's to 40's upon EMS arrival. Lopressor was discontinued upon admission. Echo showed a preserved EF of 60-65% with no regional WMA. Outpatient event monitor showed no significant arrhythmias as outlined above.   - He denies any repeat syncopal or presyncopal episodes since. HR is in the 80's during today's visit. Will continue to avoid AV nodal blocking agents with episodes of bradycardia noted during recent admission. No indication for further testing at this time.   2. HTN -BP is well controlled at 132/60 during today's visit, however he has been taking a significant amount of Losartan at 125 mg in the morning and 25 mg at night due to self-titration. We reviewed that the maximum and safest effective dose is 100 mg daily. - will reduce to 100mg  in AM. Start Amlodipine 2.5mg  daily in the evening as SBP has been in the 170's during the AM hours since Lopressor was discontinued. He will continue to follow BP at home and call back with his numbers. Can further titrate Amlodipine as needed.   3. Carotid Artery Stenosis - s/p L CEA in 03/2017. - followed by Vascular Surgery. Due for repeat dopplers later this year.     Medication Adjustments/Labs and Tests Ordered: Current medicines are reviewed at length with the patient today.  Concerns regarding medicines are outlined above.  Medication changes, Labs and Tests ordered today are listed in the Patient Instructions below. Patient Instructions  Medication Instructions:  Your physician has recommended you make the following change in your medication:  Losartan 100 mg Daily in the AM   Start Norvasc 2.5 mg Daily in the PM   Labwork: NONE  Testing/Procedures: NONE   Follow-Up: Your physician recommends that you schedule a follow-up appointment in: 2-3 months   Any Other Special Instructions Will Be Listed Below (If Applicable).  Your physician has requested that you regularly monitor and record your blood pressure readings at home. Please use the same machine at the same time of day to check your readings and record them to bring to your follow-up  visit.  If you need a refill on your cardiac medications before your next appointment, please call your pharmacy.  Thank you for choosing [facility]!      Signed, Erma Heritage, PA-C  11/07/2017 2:37 PM    [place] [address] [place], [place] [address] Phone: [phone]

## 2017-11-07 ENCOUNTER — Encounter: Payer: Self-pay | Admitting: Student

## 2017-11-07 ENCOUNTER — Ambulatory Visit: Payer: PPO | Admitting: Student

## 2017-11-07 VITALS — BP 132/60 | HR 89 | Ht 71.0 in | Wt 149.0 lb

## 2017-11-07 DIAGNOSIS — I6529 Occlusion and stenosis of unspecified carotid artery: Secondary | ICD-10-CM

## 2017-11-07 DIAGNOSIS — I10 Essential (primary) hypertension: Secondary | ICD-10-CM | POA: Diagnosis not present

## 2017-11-07 DIAGNOSIS — R55 Syncope and collapse: Secondary | ICD-10-CM

## 2017-11-07 MED ORDER — AMLODIPINE BESYLATE 2.5 MG PO TABS: 2.5000 mg | ORAL_TABLET | Freq: Every day | ORAL | 3 refills | Status: DC

## 2017-11-07 MED ORDER — LOSARTAN POTASSIUM 100 MG PO TABS: 100.0000 mg | ORAL_TABLET | Freq: Every day | ORAL | 3 refills | Status: DC

## 2017-11-07 NOTE — Patient Instructions (Signed)
Medication Instructions:  Your physician has recommended you make the following change in your medication:  Losartan 100 mg Daily in the AM  Start Norvasc 2.5 mg Daily in the PM    Labwork: NONE  Testing/Procedures: NONE   Follow-Up: Your physician recommends that you schedule a follow-up appointment in: 2-3 months    Any Other Special Instructions Will Be Listed Below (If Applicable).  Your physician has requested that you regularly monitor and record your blood pressure readings at home. Please use the same machine at the same time of day to check your readings and record them to bring to your follow-up visit.     If you need a refill on your cardiac medications before your next appointment, please call your pharmacy.  Thank you for choosing [facility]!

## 2017-11-24 ENCOUNTER — Telehealth: Payer: Self-pay | Admitting: Student

## 2017-11-24 ENCOUNTER — Telehealth: Payer: Self-pay | Admitting: *Deleted

## 2017-11-24 NOTE — Telephone Encounter (Signed)
Error

## 2017-11-24 NOTE — Telephone Encounter (Signed)
Called pt no answer, left msg to call back  

## 2017-11-24 NOTE — Telephone Encounter (Signed)
   Please let the patient know I reviewed his blood pressure log. It appears readings have been somewhat variable and are mostly elevated in the morning hours prior to or right after taking his medications and this improves during the afternoon. Overall, this is improved as compared to the numbers he was having previously. With SBP mostly being in the 140's-150's, would recommend we further titrate Amlodipine to 5 mg daily. He can take 2 tablets of his current dosing and we will send in a new Rx for 5mg  daily.  Would continue to take this in the evening hours and take Losartan in the morning. I appreciate him keeping the BP log and he can continue to check this a few times per week and report back if BP remains elevated.   Thanks,  [place]

## 2017-11-28 ENCOUNTER — Telehealth: Payer: Self-pay | Admitting: *Deleted

## 2017-11-28 NOTE — Telephone Encounter (Signed)
Called pt no answer.  LMTCB 

## 2017-12-26 ENCOUNTER — Telehealth: Payer: Self-pay | Admitting: *Deleted

## 2017-12-26 MED ORDER — AMLODIPINE BESYLATE 5 MG PO TABS: 5.0000 mg | ORAL_TABLET | Freq: Every day | ORAL | 3 refills | Status: DC

## 2017-12-26 NOTE — Telephone Encounter (Signed)
Spoke with wife. States that household has been receiving a lot of telemarketing calls. D/t to this the pt does not answerer or may hang up on callers.  Wife given medication changes. Wife voiced understanding.

## 2018-01-24 DIAGNOSIS — I10 Essential (primary) hypertension: Secondary | ICD-10-CM | POA: Diagnosis not present

## 2018-01-24 DIAGNOSIS — E782 Mixed hyperlipidemia: Secondary | ICD-10-CM | POA: Diagnosis not present

## 2018-01-24 DIAGNOSIS — R5383 Other fatigue: Secondary | ICD-10-CM | POA: Diagnosis not present

## 2018-01-25 ENCOUNTER — Other Ambulatory Visit: Payer: Self-pay

## 2018-01-25 ENCOUNTER — Encounter: Payer: Self-pay | Admitting: Cardiovascular Disease

## 2018-01-25 ENCOUNTER — Ambulatory Visit: Payer: PPO | Admitting: Cardiovascular Disease

## 2018-01-25 VITALS — BP 135/63 | HR 75 | Ht 71.0 in | Wt 149.0 lb

## 2018-01-25 DIAGNOSIS — I10 Essential (primary) hypertension: Secondary | ICD-10-CM

## 2018-01-25 DIAGNOSIS — R001 Bradycardia, unspecified: Secondary | ICD-10-CM

## 2018-01-25 DIAGNOSIS — R55 Syncope and collapse: Secondary | ICD-10-CM | POA: Diagnosis not present

## 2018-01-25 DIAGNOSIS — Z9889 Other specified postprocedural states: Secondary | ICD-10-CM

## 2018-01-25 NOTE — Patient Instructions (Addendum)
Medication Instructions:  Continue all current medications.  Labwork: none  Testing/Procedures: none  Follow-Up: As needed.    Any Other Special Instructions Will Be Listed Below (If Applicable).  If you need a refill on your cardiac medications before your next appointment, please call your pharmacy.  

## 2018-01-25 NOTE — Progress Notes (Signed)
SUBJECTIVE: The patient presents for follow-up of syncope.  This occurred in the setting of bradycardia while he was on metoprolol.  It has since been discontinued.  Interestingly, I discontinued it back in August 2018 but apparently he never stopped taking it.   Echocardiogram demonstrated normal left ventricular systolic function and regional wall motion, LVEF 60 to 65%.  Outpatient event monitor showed no significant arrhythmias.  He has had no recurrence.      Review of Systems: As per "subjective", otherwise negative.  No Known Allergies  Current Outpatient Medications  Medication Sig Dispense Refill  . acetaminophen (TYLENOL) 500 MG tablet Take 1,000 mg by mouth 2 (two) times daily as needed for moderate pain.    Marland Kitchen amLODipine (NORVASC) 5 MG tablet Take 1 tablet (5 mg total) by mouth daily. 90 tablet 3  . HYDROcodone-acetaminophen (NORCO) 7.5-325 MG tablet Take 1 tablet by mouth 2 (two) times daily as needed for moderate pain. 8 tablet 0  . losartan (COZAAR) 100 MG tablet Take 1 tablet (100 mg total) by mouth daily. 90 tablet 3   No current facility-administered medications for this visit.     Past Medical History:  Diagnosis Date  . Arthritis   . BPH (benign prostatic hyperplasia)   . Carotid artery stenosis    a. s/p L CEA in 03/2017  . Hypertension     Past Surgical History:  Procedure Laterality Date  . CAROTID ENDARTERECTOMY Left 04/17/2017  . COLONOSCOPY N/A 01/19/2017   Procedure: COLONOSCOPY;  Surgeon: Daneil Dolin, MD;  [facility];  Service: Endoscopy;  Laterality: N/A;  1:15pm  . ENDARTERECTOMY Left 04/17/2017   Procedure: ENDARTERECTOMY CAROTID - LEFT;  Surgeon: Conrad Martinsburg, MD;  Location: [place];  Service: Vascular;  Laterality: Left;  . ESOPHAGOGASTRODUODENOSCOPY N/A 01/19/2017   Procedure: ESOPHAGOGASTRODUODENOSCOPY (EGD);  Surgeon: Daneil Dolin, MD;  [facility];  Service: Endoscopy;  Laterality: N/A;  . PATCH  ANGIOPLASTY Left 04/17/2017   Procedure: PATCH ANGIOPLASTY;  Surgeon: Conrad Scott, MD;  Location: [facility];  Service: Vascular;  Laterality: Left;    Social History   Socioeconomic History  . Marital status: Married    Spouse name: Not on file  . Number of children: Not on file  . Years of education: Not on file  . Highest education level: Not on file  Occupational History  . Not on file  Social Needs  . Financial resource strain: Not on file  . Food insecurity:    Worry: Not on file    Inability: Not on file  . Transportation needs:    Medical: Not on file    Non-medical: Not on file  Tobacco Use  . Smoking status: Former Smoker    Years: 30.00    Last attempt to quit: 1980    Years since quitting: 39.4  . Smokeless tobacco: Never Used  Substance and Sexual Activity  . Alcohol use: No  . Drug use: No  . Sexual activity: Never  Lifestyle  . Physical activity:    Days per week: Not on file    Minutes per session: Not on file  . Stress: Not on file  Relationships  . Social connections:    Talks on phone: Not on file    Gets together: Not on file    Attends religious service: Not on file    Active member of club or organization: Not on file    Attends meetings of  clubs or organizations: Not on file    Relationship status: Not on file  . Intimate partner violence:    Fear of current or ex partner: Not on file    Emotionally abused: Not on file    Physically abused: Not on file    Forced sexual activity: Not on file  Other Topics Concern  . Not on file  Social History Narrative  . Not on file     Vitals:   01/25/18 1509  BP: 135/63  Pulse: 75  SpO2: 97%  Weight: 149 lb (67.6 kg)  Height: 5\' 11"  (1.803 m)    Wt Readings from Last 3 Encounters:  01/25/18 149 lb (67.6 kg)  11/07/17 149 lb (67.6 kg)  10/02/17 146 lb 2.6 oz (66.3 kg)     PHYSICAL EXAM General: NAD HEENT: Normal. Neck: No JVD, no thyromegaly. Lungs: Clear to auscultation bilaterally  with normal respiratory effort. CV: Regular rate and rhythm, normal S1/S2, no S3/S4, no murmur. No pretibial or periankle edema.    Abdomen: Soft, nontender, no distention.  Neurologic: Alert and oriented.  Psych: Normal affect. Skin: Normal. Musculoskeletal: No gross deformities.    ECG: Most recent ECG reviewed.   Labs: Lab Results  Component Value Date/Time   K 3.9 10/02/2017 03:23 AM   BUN 17 10/02/2017 03:23 AM   CREATININE 0.77 10/02/2017 03:23 AM   ALT 12 (L) 10/01/2017 12:21 PM   TSH 0.674 10/01/2017 07:27 PM   HGB 11.0 (L) 10/02/2017 05:17 AM     Lipids: No results found for: LDLCALC, LDLDIRECT, CHOL, TRIG, HDL     ASSESSMENT AND PLAN:  1.  Syncope: This occurred in the setting of bradycardia while concomitantly on beta-blockers.  Interestingly, I had instructed him to discontinue metoprolol back in August 2018 but he continued to take it until this year.  Since they have been discontinued there has been no recurrence of symptoms.  2.  Hypertension: Controlled on present therapy.  No changes.  3.  History of left carotid endarterectomy: This was performed in August 2018.  Followed by vascular surgery.   Disposition: Follow up as needed   Kate Sable, M.D., F.A.C.C.

## 2018-01-29 DIAGNOSIS — R972 Elevated prostate specific antigen [PSA]: Secondary | ICD-10-CM | POA: Diagnosis not present

## 2018-01-29 DIAGNOSIS — R7301 Impaired fasting glucose: Secondary | ICD-10-CM | POA: Diagnosis not present

## 2018-01-29 DIAGNOSIS — I10 Essential (primary) hypertension: Secondary | ICD-10-CM | POA: Diagnosis not present

## 2018-01-29 DIAGNOSIS — M1991 Primary osteoarthritis, unspecified site: Secondary | ICD-10-CM | POA: Diagnosis not present

## 2018-01-29 DIAGNOSIS — E782 Mixed hyperlipidemia: Secondary | ICD-10-CM | POA: Diagnosis not present

## 2018-01-29 DIAGNOSIS — Z6821 Body mass index (BMI) 21.0-21.9, adult: Secondary | ICD-10-CM | POA: Diagnosis not present

## 2018-01-29 DIAGNOSIS — D649 Anemia, unspecified: Secondary | ICD-10-CM | POA: Diagnosis not present

## 2018-01-29 DIAGNOSIS — N401 Enlarged prostate with lower urinary tract symptoms: Secondary | ICD-10-CM | POA: Diagnosis not present

## 2018-02-17 DIAGNOSIS — L247 Irritant contact dermatitis due to plants, except food: Secondary | ICD-10-CM | POA: Diagnosis not present

## 2018-02-17 DIAGNOSIS — Z6821 Body mass index (BMI) 21.0-21.9, adult: Secondary | ICD-10-CM | POA: Diagnosis not present

## 2018-05-25 DIAGNOSIS — R42 Dizziness and giddiness: Secondary | ICD-10-CM | POA: Diagnosis not present

## 2018-05-25 DIAGNOSIS — E782 Mixed hyperlipidemia: Secondary | ICD-10-CM | POA: Diagnosis not present

## 2018-05-25 DIAGNOSIS — R5383 Other fatigue: Secondary | ICD-10-CM | POA: Diagnosis not present

## 2018-05-25 DIAGNOSIS — R7301 Impaired fasting glucose: Secondary | ICD-10-CM | POA: Diagnosis not present

## 2018-05-25 DIAGNOSIS — D649 Anemia, unspecified: Secondary | ICD-10-CM | POA: Diagnosis not present

## 2018-05-25 DIAGNOSIS — I10 Essential (primary) hypertension: Secondary | ICD-10-CM | POA: Diagnosis not present

## 2018-05-25 DIAGNOSIS — R739 Hyperglycemia, unspecified: Secondary | ICD-10-CM | POA: Diagnosis not present

## 2018-05-29 DIAGNOSIS — Z23 Encounter for immunization: Secondary | ICD-10-CM | POA: Diagnosis not present

## 2018-05-29 DIAGNOSIS — I10 Essential (primary) hypertension: Secondary | ICD-10-CM | POA: Diagnosis not present

## 2018-05-29 DIAGNOSIS — Z6822 Body mass index (BMI) 22.0-22.9, adult: Secondary | ICD-10-CM | POA: Diagnosis not present

## 2018-05-29 DIAGNOSIS — Z0001 Encounter for general adult medical examination with abnormal findings: Secondary | ICD-10-CM | POA: Diagnosis not present

## 2018-06-08 ENCOUNTER — Ambulatory Visit (HOSPITAL_COMMUNITY)
Admission: RE | Admit: 2018-06-08 | Discharge: 2018-06-08 | Disposition: A | Discharge status: Home / Self Care | Payer: PPO | Source: Ambulatory Visit | Attending: Family | Admitting: Family

## 2018-06-08 ENCOUNTER — Encounter: Payer: Self-pay | Admitting: Family

## 2018-06-08 ENCOUNTER — Ambulatory Visit: Payer: PPO | Admitting: Family

## 2018-06-08 VITALS — BP 173/83 | HR 73 | Temp 97.1°F | Resp 16 | Ht 71.0 in | Wt 151.4 lb

## 2018-06-08 DIAGNOSIS — I6522 Occlusion and stenosis of left carotid artery: Secondary | ICD-10-CM | POA: Diagnosis not present

## 2018-06-08 DIAGNOSIS — Z9889 Other specified postprocedural states: Secondary | ICD-10-CM | POA: Diagnosis not present

## 2018-06-08 DIAGNOSIS — I6523 Occlusion and stenosis of bilateral carotid arteries: Secondary | ICD-10-CM | POA: Diagnosis not present

## 2018-06-08 NOTE — Progress Notes (Signed)
Chief Complaint: Follow up Extracranial Carotid Artery Stenosis   History of Present Illness  Andrew [name] is a 84 y.o. male who is s/p left CEA (Date: 04/17/17) by Dr. Bridgett Larsson for asx L ICA stenosis >90% .  The patient's neck incision is healed.   He denies any known history of stroke or TIA. Specifically he denies a history of amaurosis fugax or monocular blindness, unilateral facial drooping, hemiplegia, or receptive or expressive aphasia.    He denies claudication type symptoms with walking.   He was admitted to [facility] on 09-22-17 for syncope; it was determined that he became bradycardic, the beta blocker was stopped.  His cardiologist is Dr. Jacinta Shoe.   His blood pressure was 135/63 at his June 2019 visit with Dr. Jacinta Shoe, is elevated now. He denies chest pain or dyspnea, denies headache.  He walks about 20 minutes daily.   Diabetic: no Tobacco use: former smoker, quit in the 1980's, smoked x 35+ years  Pt meds include: Statin : no ASA: yes, 81 mg Other anticoagulants/antiplatelets: no   Past Medical History:  Diagnosis Date  . Arthritis   . BPH (benign prostatic hyperplasia)   . Carotid artery stenosis    a. s/p L CEA in 03/2017  . Hypertension     Social History Social History   Tobacco Use  . Smoking status: Former Smoker    Years: 30.00    Last attempt to quit: 1980    Years since quitting: 39.8  . Smokeless tobacco: Never Used  Substance Use Topics  . Alcohol use: No  . Drug use: No    Family History Family History  Problem Relation Age of Onset  . Hypertension Father   . Colon cancer Neg Hx   . Colon polyps Neg Hx     Surgical History Past Surgical History:  Procedure Laterality Date  . CAROTID ENDARTERECTOMY Left 04/17/2017  . COLONOSCOPY N/A 01/19/2017   Procedure: COLONOSCOPY;  Surgeon: Daneil Dolin, MD;  [facility];  Service: Endoscopy;  Laterality: N/A;  1:15pm  . ENDARTERECTOMY Left 04/17/2017   Procedure: ENDARTERECTOMY  CAROTID - LEFT;  Surgeon: Conrad Grandfield, MD;  Location: [place];  Service: Vascular;  Laterality: Left;  . ESOPHAGOGASTRODUODENOSCOPY N/A 01/19/2017   Procedure: ESOPHAGOGASTRODUODENOSCOPY (EGD);  Surgeon: Daneil Dolin, MD;  [facility];  Service: Endoscopy;  Laterality: N/A;  . PATCH ANGIOPLASTY Left 04/17/2017   Procedure: PATCH ANGIOPLASTY;  Surgeon: Conrad Trail, MD;  Location: [place];  Service: Vascular;  Laterality: Left;    No Known Allergies  Current Outpatient Medications  Medication Sig Dispense Refill  . acetaminophen (TYLENOL) 500 MG tablet Take 1,000 mg by mouth 2 (two) times daily as needed for moderate pain.    Marland Kitchen amLODipine (NORVASC) 5 MG tablet Take 1 tablet (5 mg total) by mouth daily. 90 tablet 3  . HYDROcodone-acetaminophen (NORCO) 7.5-325 MG tablet Take 1 tablet by mouth 2 (two) times daily as needed for moderate pain. 8 tablet 0  . losartan (COZAAR) 100 MG tablet Take 1 tablet (100 mg total) by mouth daily. 90 tablet 3   No current facility-administered medications for this visit.     Review of Systems : See HPI for pertinent positives and negatives.  Physical Examination  Vitals:   06/08/18 1123 06/08/18 1125  BP: (!) 167/73 (!) 173/83  Pulse: 76 73  Resp: 16   Temp: (!) 97.1 F (36.2 C)   TempSrc: Oral   SpO2: 99%  99%  Weight: 151 lb 6.4 oz (68.7 kg)   Height: 5\' 11"  (1.803 m)    Body mass index is 21.12 kg/m.  General: WDWN slender elderly male in NAD GAIT: normal Eyes: PERRLA HENT: No gross abnormalities.  Pulmonary:  Respirations are non-labored, good air movement in all fields, CTAB, no rales, rhonchi, or wheezes. Cardiac: regular rhythm, no detected murmur.  VASCULAR EXAM Carotid Bruits Right Left   Negative Negative     Abdominal aortic pulse is not palpable. Radial pulses are 2+ palpable and equal.                                                                                                                            LE  Pulses Right Left       POPLITEAL  not palpable   not palpable       POSTERIOR TIBIAL  not palpable   not palpable        DORSALIS PEDIS      ANTERIOR TIBIAL  palpable   palpable     Gastrointestinal: soft, nontender, BS WNL, no r/g, no palpable masses. Musculoskeletal: no muscle atrophy/wasting. M/S 5/5 throughout, extremities without ischemic changes. Skin: No rashes, no ulcers, no cellulitis.   Neurologic:  A&O X 3; appropriate affect, sensation is normal; speech is normal, CN 2-12 intact, pain and light touch intact in extremities, motor exam as listed above. Psychiatric: Normal thought content, mood appropriate to clinical situation.    Assessment: Andrew [name] is a 84 y.o. male who is s/p left CEA (Date: 04/17/17) by Dr. Bridgett Larsson for asx L ICA stenosis >90%.  He has no history of stroke or TIA.    DATA Carotid Duplex (06-08-18): Right ICA: 1-39% stenosis Left ICA: CEA site with no restenosis. Bilateral vertebral artery flow is antegrade.  Bilateral subclavian artery waveforms are normal.  Compared to exam at [facility] on 04-06-17, stable in the right ICA, postsurgical improvement in the left ICA (was 80-99% stenosed)     Plan: Follow-up in 1 year with Carotid Duplex scan.   I discussed in depth with the patient the nature of atherosclerosis, and emphasized the importance of maximal medical management including strict control of blood pressure, blood glucose, and lipid levels, obtaining regular exercise, and continued cessation of smoking.  The patient is aware that without maximal medical management the underlying atherosclerotic disease process will progress, limiting the benefit of any interventions. The patient was given information about stroke prevention and what symptoms should prompt the patient to seek immediate medical care. Thank you for allowing [place] to participate in this patient's care.  Clemon Chambers, RN, MSN, FNP-C [facility]  [place] Office: [phone]  Clinic Physician: Donzetta Matters  06/08/18 11:32 AM

## 2018-06-08 NOTE — Patient Instructions (Signed)

## 2018-07-10 DIAGNOSIS — M47816 Spondylosis without myelopathy or radiculopathy, lumbar region: Secondary | ICD-10-CM | POA: Diagnosis not present

## 2018-07-10 DIAGNOSIS — M5431 Sciatica, right side: Secondary | ICD-10-CM | POA: Diagnosis not present

## 2018-07-10 DIAGNOSIS — M170 Bilateral primary osteoarthritis of knee: Secondary | ICD-10-CM | POA: Diagnosis not present

## 2018-07-21 DIAGNOSIS — N401 Enlarged prostate with lower urinary tract symptoms: Secondary | ICD-10-CM | POA: Diagnosis not present

## 2018-07-21 DIAGNOSIS — N138 Other obstructive and reflux uropathy: Secondary | ICD-10-CM | POA: Diagnosis not present

## 2018-07-21 DIAGNOSIS — M545 Low back pain: Secondary | ICD-10-CM | POA: Diagnosis not present

## 2018-07-21 DIAGNOSIS — I10 Essential (primary) hypertension: Secondary | ICD-10-CM | POA: Diagnosis not present

## 2018-09-25 DIAGNOSIS — E7800 Pure hypercholesterolemia, unspecified: Secondary | ICD-10-CM | POA: Diagnosis not present

## 2018-09-25 DIAGNOSIS — E782 Mixed hyperlipidemia: Secondary | ICD-10-CM | POA: Diagnosis not present

## 2018-09-25 DIAGNOSIS — D649 Anemia, unspecified: Secondary | ICD-10-CM | POA: Diagnosis not present

## 2018-09-25 DIAGNOSIS — I10 Essential (primary) hypertension: Secondary | ICD-10-CM | POA: Diagnosis not present

## 2018-09-25 DIAGNOSIS — R7301 Impaired fasting glucose: Secondary | ICD-10-CM | POA: Diagnosis not present

## 2018-09-27 DIAGNOSIS — N401 Enlarged prostate with lower urinary tract symptoms: Secondary | ICD-10-CM | POA: Diagnosis not present

## 2018-09-27 DIAGNOSIS — E782 Mixed hyperlipidemia: Secondary | ICD-10-CM | POA: Diagnosis not present

## 2018-09-27 DIAGNOSIS — I10 Essential (primary) hypertension: Secondary | ICD-10-CM | POA: Diagnosis not present

## 2018-09-27 DIAGNOSIS — R55 Syncope and collapse: Secondary | ICD-10-CM | POA: Diagnosis not present

## 2018-09-27 DIAGNOSIS — R972 Elevated prostate specific antigen [PSA]: Secondary | ICD-10-CM | POA: Diagnosis not present

## 2018-09-27 DIAGNOSIS — Z6823 Body mass index (BMI) 23.0-23.9, adult: Secondary | ICD-10-CM | POA: Diagnosis not present

## 2018-09-27 DIAGNOSIS — R7301 Impaired fasting glucose: Secondary | ICD-10-CM | POA: Diagnosis not present

## 2018-09-27 DIAGNOSIS — M5431 Sciatica, right side: Secondary | ICD-10-CM | POA: Diagnosis not present

## 2018-11-22 ENCOUNTER — Other Ambulatory Visit: Payer: Self-pay | Admitting: Student

## 2018-11-22 MED ORDER — LOSARTAN POTASSIUM 100 MG PO TABS: 100.0000 mg | ORAL_TABLET | Freq: Every day | ORAL | 3 refills | Status: DC

## 2018-11-22 NOTE — Telephone Encounter (Signed)
Medication sent to pharmacy  

## 2018-11-22 NOTE — Telephone Encounter (Signed)
°*  STAT* If patient is at the pharmacy, call can be transferred to refill team.   1. Which medications need to be refilled? (please list name of each medication and dose if known) Losartan Potassium 100 mg  2. Which pharmacy/location (including street and city if local pharmacy) is medication to be sent to? [facility]  3. Do they need a 30 day or 90 day supply? 90 day supply

## 2018-12-20 DIAGNOSIS — I10 Essential (primary) hypertension: Secondary | ICD-10-CM | POA: Diagnosis not present

## 2018-12-20 DIAGNOSIS — E782 Mixed hyperlipidemia: Secondary | ICD-10-CM | POA: Diagnosis not present

## 2019-01-19 DIAGNOSIS — N400 Enlarged prostate without lower urinary tract symptoms: Secondary | ICD-10-CM | POA: Diagnosis not present

## 2019-01-19 DIAGNOSIS — E782 Mixed hyperlipidemia: Secondary | ICD-10-CM | POA: Diagnosis not present

## 2019-01-19 DIAGNOSIS — I10 Essential (primary) hypertension: Secondary | ICD-10-CM | POA: Diagnosis not present

## 2019-02-13 ENCOUNTER — Other Ambulatory Visit: Payer: Self-pay | Admitting: Student

## 2019-02-13 MED ORDER — AMLODIPINE BESYLATE 5 MG PO TABS: 5.0000 mg | ORAL_TABLET | Freq: Every day | ORAL | 1 refills | Status: DC

## 2019-02-13 NOTE — Telephone Encounter (Signed)
Medication sent to pharmacy  

## 2019-02-13 NOTE — Telephone Encounter (Signed)
°*  STAT* If patient is at the pharmacy, call can be transferred to refill team.   1. Which medications need to be refilled?amLODipine (NORVASC) 5 MG tablet   2. Which pharmacy/location (including street and city if local pharmacy) is medication to be sent to? [place]  3. Do they need a 30 day or 90 day supply?   Out of medication for past two days

## 2019-03-22 DIAGNOSIS — E782 Mixed hyperlipidemia: Secondary | ICD-10-CM | POA: Diagnosis not present

## 2019-03-22 DIAGNOSIS — I10 Essential (primary) hypertension: Secondary | ICD-10-CM | POA: Diagnosis not present

## 2019-03-22 DIAGNOSIS — E7800 Pure hypercholesterolemia, unspecified: Secondary | ICD-10-CM | POA: Diagnosis not present

## 2019-03-22 DIAGNOSIS — R7301 Impaired fasting glucose: Secondary | ICD-10-CM | POA: Diagnosis not present

## 2019-03-22 DIAGNOSIS — N400 Enlarged prostate without lower urinary tract symptoms: Secondary | ICD-10-CM | POA: Diagnosis not present

## 2019-03-26 DIAGNOSIS — M545 Low back pain: Secondary | ICD-10-CM | POA: Diagnosis not present

## 2019-03-26 DIAGNOSIS — R972 Elevated prostate specific antigen [PSA]: Secondary | ICD-10-CM | POA: Diagnosis not present

## 2019-03-26 DIAGNOSIS — Z6822 Body mass index (BMI) 22.0-22.9, adult: Secondary | ICD-10-CM | POA: Diagnosis not present

## 2019-03-26 DIAGNOSIS — R7301 Impaired fasting glucose: Secondary | ICD-10-CM | POA: Diagnosis not present

## 2019-03-26 DIAGNOSIS — R55 Syncope and collapse: Secondary | ICD-10-CM | POA: Diagnosis not present

## 2019-03-26 DIAGNOSIS — M1991 Primary osteoarthritis, unspecified site: Secondary | ICD-10-CM | POA: Diagnosis not present

## 2019-03-26 DIAGNOSIS — E782 Mixed hyperlipidemia: Secondary | ICD-10-CM | POA: Diagnosis not present

## 2019-03-26 DIAGNOSIS — I10 Essential (primary) hypertension: Secondary | ICD-10-CM | POA: Diagnosis not present

## 2019-05-22 DIAGNOSIS — I10 Essential (primary) hypertension: Secondary | ICD-10-CM | POA: Diagnosis not present

## 2019-05-22 DIAGNOSIS — E782 Mixed hyperlipidemia: Secondary | ICD-10-CM | POA: Diagnosis not present

## 2019-06-05 DIAGNOSIS — Z23 Encounter for immunization: Secondary | ICD-10-CM | POA: Diagnosis not present

## 2019-07-12 ENCOUNTER — Other Ambulatory Visit: Payer: Self-pay

## 2019-07-26 ENCOUNTER — Other Ambulatory Visit: Payer: Self-pay

## 2019-07-26 DIAGNOSIS — I6523 Occlusion and stenosis of bilateral carotid arteries: Secondary | ICD-10-CM

## 2019-07-31 ENCOUNTER — Other Ambulatory Visit: Payer: Self-pay

## 2019-07-31 ENCOUNTER — Ambulatory Visit (INDEPENDENT_AMBULATORY_CARE_PROVIDER_SITE_OTHER): Payer: PPO | Admitting: Family

## 2019-07-31 ENCOUNTER — Ambulatory Visit (HOSPITAL_COMMUNITY)
Admission: RE | Admit: 2019-07-31 | Discharge: 2019-07-31 | Disposition: A | Discharge status: Home / Self Care | Payer: PPO | Source: Ambulatory Visit | Attending: Family | Admitting: Family

## 2019-07-31 ENCOUNTER — Encounter: Payer: Self-pay | Admitting: Family

## 2019-07-31 VITALS — BP 160/76 | HR 83 | Temp 98.1°F | Resp 20 | Ht 71.0 in | Wt 154.7 lb

## 2019-07-31 DIAGNOSIS — I6523 Occlusion and stenosis of bilateral carotid arteries: Secondary | ICD-10-CM | POA: Insufficient documentation

## 2019-07-31 DIAGNOSIS — Z9889 Other specified postprocedural states: Secondary | ICD-10-CM

## 2019-07-31 NOTE — Patient Instructions (Signed)

## 2019-07-31 NOTE — Progress Notes (Signed)
Virtual Visit via Telephone Note   I connected with Andrew [name] on 07/31/2019 using the [employer] by telephone and verified that I was speaking with the correct person using two identifiers. Patient was located at his home and accompanied by himself. I am located at the [facility] office/clinic.   The limitations of evaluation and management by telemedicine and the availability of in person appointments have been previously discussed with the patient and are documented in the patients chart. The patient expressed understanding and consented to proceed.  PCP: Manon Hilding, MD  Chief Complaint: follow up extracranial carotid artery stenosis   History of Present Illness: Andrew [name] is a 85 y.o. male who is s/p leftCEA (Date: 04/17/17) by Dr. Lajoyce Corners asx L ICA stenosis >90%.   He denies any known history of stroke or TIA. Specifically he deniesa history of amaurosis fugax or monocular blindness, unilateral facial drooping, hemiplegia, orreceptive or expressive aphasia.    He denies claudication type symptoms with walking.   He was admitted to [facility] on 09-22-17 for syncope; it was determined that he became bradycardic, the beta blocker was stopped.  His cardiologist is Dr. Jacinta Shoe.   He denies chest pain or dyspnea, denies headache.  He walks about 20 minutes daily.   Diabetic: no Tobacco use: former smoker, quit in the 1980's, smoked x 35+ years  Pt meds include: Statin : no ASA: yes, 81 mg Other anticoagulants/antiplatelets: no   Past Medical History:  Diagnosis Date  . Arthritis   . BPH (benign prostatic hyperplasia)   . Carotid artery stenosis    a. s/p L CEA in 03/2017  . Hypertension     Past Surgical History:  Procedure Laterality Date  . CAROTID ENDARTERECTOMY Left 04/17/2017  . COLONOSCOPY N/A 01/19/2017   Procedure: COLONOSCOPY;  Surgeon: Daneil Dolin, MD;  [facility];  Service: Endoscopy;  Laterality: N/A;  1:15pm  . ENDARTERECTOMY  Left 04/17/2017   Procedure: ENDARTERECTOMY CAROTID - LEFT;  Surgeon: Conrad [place] Park, MD;  Location: [place];  Service: Vascular;  Laterality: Left;  . ESOPHAGOGASTRODUODENOSCOPY N/A 01/19/2017   Procedure: ESOPHAGOGASTRODUODENOSCOPY (EGD);  Surgeon: Daneil Dolin, MD;  [facility];  Service: Endoscopy;  Laterality: N/A;  . PATCH ANGIOPLASTY Left 04/17/2017   Procedure: PATCH ANGIOPLASTY;  Surgeon: Conrad Vevay, MD;  Location: [place];  Service: Vascular;  Laterality: Left;    Current Meds  Medication Sig  . acetaminophen (TYLENOL) 500 MG tablet Take 1,000 mg by mouth 2 (two) times daily as needed for moderate pain.  Marland Kitchen amLODipine (NORVASC) 5 MG tablet Take 1 tablet (5 mg total) by mouth daily.  Marland Kitchen losartan (COZAAR) 100 MG tablet Take 1 tablet (100 mg total) by mouth daily.  . tamsulosin (FLOMAX) 0.4 MG CAPS capsule Take 0.4 mg by mouth daily.   No Known Allergies  12 system ROS was negative unless otherwise noted in HPI   Observations/Objective:  DATA Carotid Duplex (07-31-19): Right Carotid: Velocities in the right ICA are consistent with a 1-39% stenosis.                Non-hemodynamically significant plaque <50% noted in the CCA. The                ECA appears <50% stenosed. Left Carotid: Velocities in the left ICA are consistent with a 40-59% stenosis (low end of range).               Non-hemodynamically  significant plaque <50% noted in the CCA.               Patent carotid endarterectomy without evidence of restenosis. Vertebrals:  Bilateral vertebral arteries demonstrate antegrade flow. Subclavians: Normal flow hemodynamics were seen in bilateral subclavian arteries. Increased stenosis in the left ICA (CEA site), stable in the right, compared to the exam on 06-08-18.   Assessment and Plan: Andrew [name] is a 85 y.o. male who is s/p leftCEA (Date: 04/17/17) by Dr. Lajoyce Corners asx L ICA stenosis >90%.  He has no history of stroke or TIA.   Today's carotid duplex shows  1-39% right ICA stenosis and 40-59% left ICA stenosis (low end of range). Increased stenosis in the left ICA (CEA site), stable in the right, compared to the exam on 06-08-18.   His atherosclerotic risk factors include 35+year history of smoking (qi[place] in [place]), advanced age, and hypertension.  Fortunately he does not have DM.   He takes a daily 81 mg ASA, but does not take a statin, he denies any advisers reaction to statins. Consider statin therapy which is associated with a greater reduction in CVD risk and improved endothelial function, will defer to pt's PCP.   Follow Up Instructions:   Follow up 9 months with carotid duplex.    I discussed the assessment and treatment plan with the patient. The patient was provided an opportunity to ask questions and all were answered. The patient agreed with the plan and demonstrated an understanding of the instructions.   The patient was advised to call back or seek an in-person evaluation if the symptoms worsen or if the condition fails to improve as anticipated.  I spent 11 minutes with the patient via telephone encounter.   Gabrielle Dare Oaklee Esther [facility] [place] Office: [phone]  07/31/2019, 5:07 PM

## 2019-08-07 ENCOUNTER — Other Ambulatory Visit: Payer: Self-pay | Admitting: Cardiovascular Disease

## 2019-08-20 ENCOUNTER — Other Ambulatory Visit: Payer: Self-pay | Admitting: *Deleted

## 2019-08-20 DIAGNOSIS — I6523 Occlusion and stenosis of bilateral carotid arteries: Secondary | ICD-10-CM

## 2019-08-21 ENCOUNTER — Other Ambulatory Visit: Payer: Self-pay | Admitting: Cardiovascular Disease

## 2019-08-22 DIAGNOSIS — E782 Mixed hyperlipidemia: Secondary | ICD-10-CM | POA: Diagnosis not present

## 2019-08-22 DIAGNOSIS — I10 Essential (primary) hypertension: Secondary | ICD-10-CM | POA: Diagnosis not present

## 2019-09-11 ENCOUNTER — Other Ambulatory Visit: Payer: Self-pay | Admitting: Cardiovascular Disease

## 2019-09-12 ENCOUNTER — Other Ambulatory Visit: Payer: Self-pay | Admitting: *Deleted

## 2019-09-12 MED ORDER — AMLODIPINE BESYLATE 5 MG PO TABS: 5.0000 mg | ORAL_TABLET | Freq: Every day | ORAL | 0 refills | Status: DC

## 2019-09-12 NOTE — Telephone Encounter (Signed)
Advised that future refill request for medications should be sent to his PCP. Reports his next PCP visit is in February 2021 and need enough until that visit. Verbalized understanding.

## 2019-09-23 DIAGNOSIS — I10 Essential (primary) hypertension: Secondary | ICD-10-CM | POA: Diagnosis not present

## 2019-09-23 DIAGNOSIS — E782 Mixed hyperlipidemia: Secondary | ICD-10-CM | POA: Diagnosis not present

## 2019-09-23 DIAGNOSIS — E7800 Pure hypercholesterolemia, unspecified: Secondary | ICD-10-CM | POA: Diagnosis not present

## 2019-09-23 DIAGNOSIS — R739 Hyperglycemia, unspecified: Secondary | ICD-10-CM | POA: Diagnosis not present

## 2019-09-26 DIAGNOSIS — R55 Syncope and collapse: Secondary | ICD-10-CM | POA: Diagnosis not present

## 2019-09-26 DIAGNOSIS — Z0001 Encounter for general adult medical examination with abnormal findings: Secondary | ICD-10-CM | POA: Diagnosis not present

## 2019-09-26 DIAGNOSIS — Z6822 Body mass index (BMI) 22.0-22.9, adult: Secondary | ICD-10-CM | POA: Diagnosis not present

## 2019-09-26 DIAGNOSIS — M5431 Sciatica, right side: Secondary | ICD-10-CM | POA: Diagnosis not present

## 2019-09-26 DIAGNOSIS — N401 Enlarged prostate with lower urinary tract symptoms: Secondary | ICD-10-CM | POA: Diagnosis not present

## 2019-09-26 DIAGNOSIS — N138 Other obstructive and reflux uropathy: Secondary | ICD-10-CM | POA: Diagnosis not present

## 2019-09-26 DIAGNOSIS — R972 Elevated prostate specific antigen [PSA]: Secondary | ICD-10-CM | POA: Diagnosis not present

## 2019-09-26 DIAGNOSIS — I10 Essential (primary) hypertension: Secondary | ICD-10-CM | POA: Diagnosis not present

## 2019-10-03 ENCOUNTER — Ambulatory Visit: Payer: PPO

## 2019-10-18 DIAGNOSIS — E7849 Other hyperlipidemia: Secondary | ICD-10-CM | POA: Diagnosis not present

## 2019-10-18 DIAGNOSIS — I10 Essential (primary) hypertension: Secondary | ICD-10-CM | POA: Diagnosis not present

## 2019-11-20 DIAGNOSIS — I10 Essential (primary) hypertension: Secondary | ICD-10-CM | POA: Diagnosis not present

## 2019-11-20 DIAGNOSIS — E7849 Other hyperlipidemia: Secondary | ICD-10-CM | POA: Diagnosis not present

## 2019-11-21 ENCOUNTER — Other Ambulatory Visit: Payer: Self-pay | Admitting: Cardiovascular Disease

## 2019-12-20 DIAGNOSIS — E7801 Familial hypercholesterolemia: Secondary | ICD-10-CM | POA: Diagnosis not present

## 2019-12-20 DIAGNOSIS — I10 Essential (primary) hypertension: Secondary | ICD-10-CM | POA: Diagnosis not present

## 2020-02-21 ENCOUNTER — Other Ambulatory Visit: Payer: Self-pay

## 2020-02-21 ENCOUNTER — Ambulatory Visit: Payer: PPO | Admitting: Urology

## 2020-02-21 ENCOUNTER — Encounter: Payer: Self-pay | Admitting: Urology

## 2020-02-21 VITALS — BP 164/71 | HR 92 | Temp 97.5°F | Ht 71.0 in | Wt 160.0 lb

## 2020-02-21 DIAGNOSIS — R351 Nocturia: Secondary | ICD-10-CM

## 2020-02-21 DIAGNOSIS — N401 Enlarged prostate with lower urinary tract symptoms: Secondary | ICD-10-CM | POA: Diagnosis not present

## 2020-02-21 DIAGNOSIS — R3912 Poor urinary stream: Secondary | ICD-10-CM | POA: Diagnosis not present

## 2020-02-21 DIAGNOSIS — N138 Other obstructive and reflux uropathy: Secondary | ICD-10-CM

## 2020-02-21 NOTE — Progress Notes (Signed)
Subjective: 1. Benign prostatic hyperplasia with urinary obstruction   2. Nocturia   3. Weak urinary stream      Andrew [name] is an 86 yo male who is sent by Dr. Quintin Alto for BPH with BOO.   He has nocturia q2-3hrs.  He can have some hesitancy.  He doesn't feel he empties completely and has some intermittency.  He has no issues with urgency.  He has no daytime frequency.  He has a reduced stream.  He was given tamsulosin in the past for nocturia but it  made him dizzy so he stopped it after just a few days.   He has had no UTI's.  He has had no stones or GU surgery.    He was unable to get a urine today.  ROS:  ROS  No Known Allergies  Past Medical History:  Diagnosis Date  . Arthritis   . BPH (benign prostatic hyperplasia)   . Carotid artery stenosis    a. s/p L CEA in 03/2017  . Hypertension     Past Surgical History:  Procedure Laterality Date  . CAROTID ENDARTERECTOMY Left 04/17/2017  . COLONOSCOPY N/A 01/19/2017   Procedure: COLONOSCOPY;  Surgeon: Daneil Dolin, MD;  [facility];  Service: Endoscopy;  Laterality: N/A;  1:15pm  . ENDARTERECTOMY Left 04/17/2017   Procedure: ENDARTERECTOMY CAROTID - LEFT;  Surgeon: Conrad Tiffin, MD;  Location: [place];  Service: Vascular;  Laterality: Left;  . ESOPHAGOGASTRODUODENOSCOPY N/A 01/19/2017   Procedure: ESOPHAGOGASTRODUODENOSCOPY (EGD);  Surgeon: Daneil Dolin, MD;  [facility];  Service: Endoscopy;  Laterality: N/A;  . PATCH ANGIOPLASTY Left 04/17/2017   Procedure: PATCH ANGIOPLASTY;  Surgeon: Conrad , MD;  Location: [facility] OR;  Service: Vascular;  Laterality: Left;    Social History   Socioeconomic History  . Marital status: Married    Spouse name: Not on file  . Number of children: 1  . Years of education: Not on file  . Highest education level: Not on file  Occupational History  . Occupation: retired  Tobacco Use  . Smoking status: Former Smoker    Years: 30.00    Quit date: 1980    Years since  quitting: 41.5  . Smokeless tobacco: Never Used  Vaping Use  . Vaping Use: Never used  Substance and Sexual Activity  . Alcohol use: No  . Drug use: No  . Sexual activity: Never  Other Topics Concern  . Not on file  Social History Narrative  . Not on file   Social Determinants of Health   Financial Resource Strain:   . Difficulty of Paying Living Expenses:   Food Insecurity:   . Worried About Charity fundraiser in the Last Year:   . Arboriculturist in the Last Year:   Transportation Needs:   . Film/video editor (Medical):   Marland Kitchen Lack of Transportation (Non-Medical):   Physical Activity:   . Days of Exercise per Week:   . Minutes of Exercise per Session:   Stress:   . Feeling of Stress :   Social Connections:   . Frequency of Communication with Friends and Family:   . Frequency of Social Gatherings with Friends and Family:   . Attends Religious Services:   . Active Member of Clubs or Organizations:   . Attends Archivist Meetings:   Marland Kitchen Marital Status:   Intimate Partner Violence:   . Fear of Current or Ex-Partner:   . Emotionally  Abused:   Marland Kitchen Physically Abused:   . Sexually Abused:     Family History  Problem Relation Age of Onset  . Hypertension Father   . Colon cancer Neg Hx   . Colon polyps Neg Hx     Anti-infectives: Anti-infectives (From admission, onward)   None      Current Outpatient Medications  Medication Sig Dispense Refill  . acetaminophen (TYLENOL) 500 MG tablet Take 1,000 mg by mouth 2 (two) times daily as needed for moderate pain.    Marland Kitchen amLODipine (NORVASC) 5 MG tablet Take 1 tablet (5 mg total) by mouth daily. 30 tablet 0  . losartan (COZAAR) 100 MG tablet TAKE 1 TABLET ONCE DAILY. 15 tablet 0   No current facility-administered medications for this visit.     Objective: Vital signs in last 24 hours: BP (!) 164/71   Pulse 92   Temp (!) 97.5 F (36.4 C)   Ht 5\' 11"  (1.803 m)   Wt 160 lb (72.6 kg)   BMI 22.32 kg/m    Intake/Output from previous day: No intake/output data recorded. Intake/Output this shift: @IOTHISSHIFT @   Physical Exam Vitals reviewed.  Constitutional:      Appearance: Normal appearance.  Genitourinary:    Comments: Normal phallus with adequate meatus. Scrotum, testes and epididymis unremarkable.  AP without lesions. NST, firm stool in the vault, no mass. Prostate 1+ firm without nodules. SV normal.   Musculoskeletal:        General: No swelling or tenderness. Normal range of motion.  Skin:    General: Skin is warm and dry.  Neurological:     General: No focal deficit present.     Mental Status: He is alert and oriented to person, place, and time.     Lab Results:  No results found for this or any previous visit (from the past 24 hour(s)).  BMET No results for input(s): NA, K, CL, CO2, GLUCOSE, BUN, CREATININE, CALCIUM in the last 72 hours. PT/INR No results for input(s): LABPROT, INR in the last 72 hours. ABG No results for input(s): PHART, HCO3 in the last 72 hours.  Invalid input(s): PCO2, PO2  Studies/Results: I have reviewed notes from Dr. Quintin Alto.   PVR is 30ml   Assessment/Plan: 1.  Nocturia.   He has fairly isolated nocturia which could be from BPH,  Dietary indiscretion or loss of diurnal secretion of DDAVP.  He reports that he is a good sleeper so Apnea is less of a concern.   I am going to have him keep a 48hr voiding diary and will have him return in a month.   I also recommended he cut out the 4oz of coke that he consumes at 730pm daily.    2.  BPH with BOO.  His prostate is small and firm without nodules and he reports a weak stream.  I will try to get a flowrate at f/u.    No orders of the defined types were placed in this encounter.    Orders Placed This Encounter  Procedures  . POCT urinalysis dipstick     Return in about 4 weeks (around 03/20/2020) for for flowrate and IPSS. .    CC: Dr. Consuello Masse.      Irine Seal 02/21/2020 [phone]

## 2020-02-21 NOTE — Progress Notes (Signed)
Urological Symptom Review  Patient is experiencing the following symptoms: Frequent urination Get up at night to urinate Weak stream   Review of Systems  Gastrointestinal (upper)  : Negative for upper GI symptoms  Gastrointestinal (lower) : Negative for lower GI symptoms  Constitutional : Negative for symptoms  Skin: Negative for skin symptoms  Eyes: Negative for eye symptoms  Ear/Nose/Throat : Negative for Ear/Nose/Throat symptoms  Hematologic/Lymphatic: Negative for Hematologic/Lymphatic symptoms  Cardiovascular : Negative for cardiovascular symptoms  Respiratory : Negative for respiratory symptoms  Endocrine: Negative for endocrine symptoms  Musculoskeletal: Negative for musculoskeletal symptoms  Neurological: Negative for neurological symptoms  Psychologic: Negative for psychiatric symptoms

## 2020-03-04 DIAGNOSIS — I10 Essential (primary) hypertension: Secondary | ICD-10-CM | POA: Diagnosis not present

## 2020-03-04 DIAGNOSIS — N401 Enlarged prostate with lower urinary tract symptoms: Secondary | ICD-10-CM | POA: Diagnosis not present

## 2020-03-04 DIAGNOSIS — R351 Nocturia: Secondary | ICD-10-CM | POA: Diagnosis not present

## 2020-03-04 DIAGNOSIS — R5383 Other fatigue: Secondary | ICD-10-CM | POA: Diagnosis not present

## 2020-03-04 DIAGNOSIS — Z6821 Body mass index (BMI) 21.0-21.9, adult: Secondary | ICD-10-CM | POA: Diagnosis not present

## 2020-03-20 DIAGNOSIS — N400 Enlarged prostate without lower urinary tract symptoms: Secondary | ICD-10-CM | POA: Diagnosis not present

## 2020-03-20 DIAGNOSIS — I10 Essential (primary) hypertension: Secondary | ICD-10-CM | POA: Diagnosis not present

## 2020-03-20 DIAGNOSIS — R7301 Impaired fasting glucose: Secondary | ICD-10-CM | POA: Diagnosis not present

## 2020-03-20 DIAGNOSIS — R630 Anorexia: Secondary | ICD-10-CM | POA: Diagnosis not present

## 2020-03-20 DIAGNOSIS — E7800 Pure hypercholesterolemia, unspecified: Secondary | ICD-10-CM | POA: Diagnosis not present

## 2020-03-20 DIAGNOSIS — E782 Mixed hyperlipidemia: Secondary | ICD-10-CM | POA: Diagnosis not present

## 2020-03-25 DIAGNOSIS — N138 Other obstructive and reflux uropathy: Secondary | ICD-10-CM | POA: Diagnosis not present

## 2020-03-25 DIAGNOSIS — N401 Enlarged prostate with lower urinary tract symptoms: Secondary | ICD-10-CM | POA: Diagnosis not present

## 2020-03-25 DIAGNOSIS — E782 Mixed hyperlipidemia: Secondary | ICD-10-CM | POA: Diagnosis not present

## 2020-03-25 DIAGNOSIS — Z6821 Body mass index (BMI) 21.0-21.9, adult: Secondary | ICD-10-CM | POA: Diagnosis not present

## 2020-03-25 DIAGNOSIS — R55 Syncope and collapse: Secondary | ICD-10-CM | POA: Diagnosis not present

## 2020-03-25 DIAGNOSIS — M5431 Sciatica, right side: Secondary | ICD-10-CM | POA: Diagnosis not present

## 2020-03-25 DIAGNOSIS — R972 Elevated prostate specific antigen [PSA]: Secondary | ICD-10-CM | POA: Diagnosis not present

## 2020-03-25 DIAGNOSIS — I10 Essential (primary) hypertension: Secondary | ICD-10-CM | POA: Diagnosis not present

## 2020-04-09 NOTE — Progress Notes (Signed)
Subjective: 1. Nocturia   2. Benign prostatic hyperplasia with urinary obstruction   3. Elevated PSA      Andrew [name] returns today in f/u for his history of nocturia.   He reports that the nocturia is down to 3-4x from 4-5x. He didn't stop the coke in the evenings and drinks a lot of tea in the daytime.  His IPSS is 11.   He did a voiding diary and >50% of his UOP is at night.  His labs in July from Dr. Quintin Alto had a Cr of 0.9 and a sodium of 137.   His PSA was up to 9.9 from 6.7 over the past year.  He thinks it was about a 5 prior to that.   He has a reduced stream but is emptying well with a PVR of about 31ml.    Andrew [name] is an 86 yo male who is sent by Dr. Quintin Alto for BPH with BOO.   He has nocturia q2-3hrs.  He can have some hesitancy.  He doesn't feel he empties completely and has some intermittency.  He has no issues with urgency.  He has no daytime frequency.  He has a reduced stream.  He was given tamsulosin in the past for nocturia but it  made him dizzy so he stopped it after just a few days.   He has had no UTI's.  He has had no stones or GU surgery.    He was unable to get a urine today.  ROS:  ROS  No Known Allergies  Past Medical History:  Diagnosis Date  . Arthritis   . BPH (benign prostatic hyperplasia)   . Carotid artery stenosis    a. s/p L CEA in 03/2017  . Hypertension     Past Surgical History:  Procedure Laterality Date  . CAROTID ENDARTERECTOMY Left 04/17/2017  . COLONOSCOPY N/A 01/19/2017   Procedure: COLONOSCOPY;  Surgeon: Daneil Dolin, MD;  [facility];  Service: Endoscopy;  Laterality: N/A;  1:15pm  . ENDARTERECTOMY Left 04/17/2017   Procedure: ENDARTERECTOMY CAROTID - LEFT;  Surgeon: Conrad Halifax, MD;  Location: [facility];  Service: Vascular;  Laterality: Left;  . ESOPHAGOGASTRODUODENOSCOPY N/A 01/19/2017   Procedure: ESOPHAGOGASTRODUODENOSCOPY (EGD);  Surgeon: Daneil Dolin, MD;  [facility];  Service: Endoscopy;  Laterality: N/A;  . PATCH  ANGIOPLASTY Left 04/17/2017   Procedure: PATCH ANGIOPLASTY;  Surgeon: Conrad Holt, MD;  Location: [facility];  Service: Vascular;  Laterality: Left;    Social History   Socioeconomic History  . Marital status: Married    Spouse name: Not on file  . Number of children: 1  . Years of education: Not on file  . Highest education level: Not on file  Occupational History  . Occupation: retired  Tobacco Use  . Smoking status: Former Smoker    Years: 30.00    Quit date: 1980    Years since quitting: 41.6  . Smokeless tobacco: Never Used  Vaping Use  . Vaping Use: Never used  Substance and Sexual Activity  . Alcohol use: No  . Drug use: No  . Sexual activity: Never  Other Topics Concern  . Not on file  Social History Narrative  . Not on file   Social Determinants of Health   Financial Resource Strain:   . Difficulty of Paying Living Expenses: Not on file  Food Insecurity:   . Worried About Charity fundraiser in the Last Year: Not on file  .  Ran Out of Food in the Last Year: Not on file  Transportation Needs:   . Lack of Transportation (Medical): Not on file  . Lack of Transportation (Non-Medical): Not on file  Physical Activity:   . Days of Exercise per Week: Not on file  . Minutes of Exercise per Session: Not on file  Stress:   . Feeling of Stress : Not on file  Social Connections:   . Frequency of Communication with Friends and Family: Not on file  . Frequency of Social Gatherings with Friends and Family: Not on file  . Attends Religious Services: Not on file  . Active Member of Clubs or Organizations: Not on file  . Attends Archivist Meetings: Not on file  . Marital Status: Not on file  Intimate Partner Violence:   . Fear of Current or Ex-Partner: Not on file  . Emotionally Abused: Not on file  . Physically Abused: Not on file  . Sexually Abused: Not on file    Family History  Problem Relation Age of Onset  . Hypertension Father   . Colon cancer Neg Hx    . Colon polyps Neg Hx     Anti-infectives: Anti-infectives (From admission, onward)   None      Current Outpatient Medications  Medication Sig Dispense Refill  . acetaminophen (TYLENOL) 500 MG tablet Take 1,000 mg by mouth 2 (two) times daily as needed for moderate pain.    Marland Kitchen amLODipine (NORVASC) 5 MG tablet Take 1 tablet (5 mg total) by mouth daily. 30 tablet 0  . losartan (COZAAR) 100 MG tablet TAKE 1 TABLET ONCE DAILY. 15 tablet 0   No current facility-administered medications for this visit.     Objective: Vital signs in last 24 hours: There were no vitals taken for this visit.  Intake/Output from previous day: No intake/output data recorded. Intake/Output this shift: @IOTHISSHIFT @   Physical Exam  Lab Results:  No results found for this or any previous visit (from the past 24 hour(s)).  BMET No results for input(s): NA, K, CL, CO2, GLUCOSE, BUN, CREATININE, CALCIUM in the last 72 hours. PT/INR No results for input(s): LABPROT, INR in the last 72 hours. ABG No results for input(s): PHART, HCO3 in the last 72 hours.  Invalid input(s): PCO2, PO2  Studies/Results: I have reviewed labs from Dr. Quintin Alto including the CMP, CBC and PSA's. Marland Kitchen   PVR is 52ml   Assessment/Plan: 1.  Nocturia.   He has fairly isolated nocturia which could be from BPH,  Dietary indiscretion or loss of diurnal secretion of DDAVP.  His voiding diary is most consistent with loss of diurnal variation.   I have recommended that he cut out tea and coke and reduce his fluid intake in the afternoon.   I am reluctant to consider DDAVP at his age but if he doesn't improve with behavioral modification, I will reconsider that.    2.  BPH with BOO and an elevated PSA.  His PSA was up to 9.9 from 6.7 last year and  his prostate was small and firm without nodules on the exam at his last visit.   I have recommended a prostate biopsy and reviewed the risks but he doesn't want to do any procedures in the  hospital with the pandemic going on.  I will have him return in 6 months with a PSA.   He is emptying his bladder wall.     No orders of the defined types were placed in this  encounter.    Orders Placed This Encounter  Procedures  . PSA, total and free    Standing Status:   Future    Standing Expiration Date:   04/10/2021     Return in about 6 months (around 10/11/2020) for with PSA.    CC: Dr. Consuello Masse.      Irine Seal 04/10/2020 [phone]

## 2020-04-10 ENCOUNTER — Ambulatory Visit (INDEPENDENT_AMBULATORY_CARE_PROVIDER_SITE_OTHER): Payer: PPO | Admitting: Urology

## 2020-04-10 ENCOUNTER — Encounter: Payer: Self-pay | Admitting: Urology

## 2020-04-10 ENCOUNTER — Other Ambulatory Visit: Payer: Self-pay

## 2020-04-10 DIAGNOSIS — N138 Other obstructive and reflux uropathy: Secondary | ICD-10-CM | POA: Diagnosis not present

## 2020-04-10 DIAGNOSIS — R972 Elevated prostate specific antigen [PSA]: Secondary | ICD-10-CM

## 2020-04-10 DIAGNOSIS — R351 Nocturia: Secondary | ICD-10-CM

## 2020-04-10 DIAGNOSIS — N401 Enlarged prostate with lower urinary tract symptoms: Secondary | ICD-10-CM | POA: Diagnosis not present

## 2020-04-10 NOTE — Progress Notes (Signed)
Urological Symptom Review PVR 10.8  Unable to void for flow rate Patient is experiencing the following symptoms: none  Review of Systems  Gastrointestinal (upper)  : Negative for upper GI symptoms  Gastrointestinal (lower) : Negative for lower GI symptoms  Constitutional : Negative for symptoms  Skin: Negative for skin symptoms  Eyes: Negative for eye symptoms  Ear/Nose/Throat : Negative for Ear/Nose/Throat symptoms  Hematologic/Lymphatic: Negative for Hematologic/Lymphatic symptoms  Cardiovascular : Negative for cardiovascular symptoms  Respiratory : Negative for respiratory symptoms  Endocrine: Negative for endocrine symptoms  Musculoskeletal: Negative for musculoskeletal symptoms  Neurological: Negative for neurological symptoms  Psychologic: Negative for psychiatric symptoms

## 2020-06-15 DIAGNOSIS — Z23 Encounter for immunization: Secondary | ICD-10-CM | POA: Diagnosis not present

## 2020-09-21 DIAGNOSIS — E7801 Familial hypercholesterolemia: Secondary | ICD-10-CM | POA: Diagnosis not present

## 2020-09-21 DIAGNOSIS — E7800 Pure hypercholesterolemia, unspecified: Secondary | ICD-10-CM | POA: Diagnosis not present

## 2020-09-21 DIAGNOSIS — R7301 Impaired fasting glucose: Secondary | ICD-10-CM | POA: Diagnosis not present

## 2020-09-21 DIAGNOSIS — E782 Mixed hyperlipidemia: Secondary | ICD-10-CM | POA: Diagnosis not present

## 2020-09-21 DIAGNOSIS — E7849 Other hyperlipidemia: Secondary | ICD-10-CM | POA: Diagnosis not present

## 2020-09-21 DIAGNOSIS — I10 Essential (primary) hypertension: Secondary | ICD-10-CM | POA: Diagnosis not present

## 2020-09-21 DIAGNOSIS — E559 Vitamin D deficiency, unspecified: Secondary | ICD-10-CM | POA: Diagnosis not present

## 2020-09-24 DIAGNOSIS — R7301 Impaired fasting glucose: Secondary | ICD-10-CM | POA: Diagnosis not present

## 2020-09-24 DIAGNOSIS — E7849 Other hyperlipidemia: Secondary | ICD-10-CM | POA: Diagnosis not present

## 2020-09-24 DIAGNOSIS — R972 Elevated prostate specific antigen [PSA]: Secondary | ICD-10-CM | POA: Diagnosis not present

## 2020-09-24 DIAGNOSIS — I10 Essential (primary) hypertension: Secondary | ICD-10-CM | POA: Diagnosis not present

## 2020-09-24 DIAGNOSIS — R55 Syncope and collapse: Secondary | ICD-10-CM | POA: Diagnosis not present

## 2020-09-24 DIAGNOSIS — E559 Vitamin D deficiency, unspecified: Secondary | ICD-10-CM | POA: Diagnosis not present

## 2020-09-24 DIAGNOSIS — N401 Enlarged prostate with lower urinary tract symptoms: Secondary | ICD-10-CM | POA: Diagnosis not present

## 2020-09-24 DIAGNOSIS — Z6821 Body mass index (BMI) 21.0-21.9, adult: Secondary | ICD-10-CM | POA: Diagnosis not present

## 2020-10-02 ENCOUNTER — Other Ambulatory Visit: Payer: PPO

## 2020-10-08 ENCOUNTER — Ambulatory Visit: Payer: PPO | Admitting: Urology

## 2020-10-09 ENCOUNTER — Ambulatory Visit: Payer: PPO | Admitting: Urology

## 2021-03-18 DIAGNOSIS — M25519 Pain in unspecified shoulder: Secondary | ICD-10-CM | POA: Diagnosis not present

## 2021-03-18 DIAGNOSIS — M545 Low back pain, unspecified: Secondary | ICD-10-CM | POA: Diagnosis not present

## 2021-03-18 DIAGNOSIS — R5383 Other fatigue: Secondary | ICD-10-CM | POA: Diagnosis not present

## 2021-03-18 DIAGNOSIS — Z6821 Body mass index (BMI) 21.0-21.9, adult: Secondary | ICD-10-CM | POA: Diagnosis not present

## 2021-03-18 DIAGNOSIS — M25569 Pain in unspecified knee: Secondary | ICD-10-CM | POA: Diagnosis not present

## 2021-03-18 DIAGNOSIS — R0609 Other forms of dyspnea: Secondary | ICD-10-CM | POA: Diagnosis not present

## 2021-03-29 DIAGNOSIS — M170 Bilateral primary osteoarthritis of knee: Secondary | ICD-10-CM | POA: Diagnosis not present

## 2021-04-01 DIAGNOSIS — Z0001 Encounter for general adult medical examination with abnormal findings: Secondary | ICD-10-CM | POA: Diagnosis not present

## 2021-04-01 DIAGNOSIS — R5383 Other fatigue: Secondary | ICD-10-CM | POA: Diagnosis not present

## 2021-04-01 DIAGNOSIS — N138 Other obstructive and reflux uropathy: Secondary | ICD-10-CM | POA: Diagnosis not present

## 2021-04-01 DIAGNOSIS — E782 Mixed hyperlipidemia: Secondary | ICD-10-CM | POA: Diagnosis not present

## 2021-04-01 DIAGNOSIS — M5431 Sciatica, right side: Secondary | ICD-10-CM | POA: Diagnosis not present

## 2021-04-01 DIAGNOSIS — E7849 Other hyperlipidemia: Secondary | ICD-10-CM | POA: Diagnosis not present

## 2021-04-01 DIAGNOSIS — E559 Vitamin D deficiency, unspecified: Secondary | ICD-10-CM | POA: Diagnosis not present

## 2021-04-01 DIAGNOSIS — R7301 Impaired fasting glucose: Secondary | ICD-10-CM | POA: Diagnosis not present

## 2021-04-01 DIAGNOSIS — R55 Syncope and collapse: Secondary | ICD-10-CM | POA: Diagnosis not present

## 2021-04-01 DIAGNOSIS — D649 Anemia, unspecified: Secondary | ICD-10-CM | POA: Diagnosis not present

## 2021-04-01 DIAGNOSIS — N401 Enlarged prostate with lower urinary tract symptoms: Secondary | ICD-10-CM | POA: Diagnosis not present

## 2021-04-01 DIAGNOSIS — M1991 Primary osteoarthritis, unspecified site: Secondary | ICD-10-CM | POA: Diagnosis not present

## 2021-04-01 DIAGNOSIS — I10 Essential (primary) hypertension: Secondary | ICD-10-CM | POA: Diagnosis not present

## 2021-04-01 DIAGNOSIS — M545 Low back pain, unspecified: Secondary | ICD-10-CM | POA: Diagnosis not present

## 2021-04-01 DIAGNOSIS — R972 Elevated prostate specific antigen [PSA]: Secondary | ICD-10-CM | POA: Diagnosis not present

## 2021-04-05 DIAGNOSIS — L570 Actinic keratosis: Secondary | ICD-10-CM | POA: Diagnosis not present

## 2021-04-05 DIAGNOSIS — D044 Carcinoma in situ of skin of scalp and neck: Secondary | ICD-10-CM | POA: Diagnosis not present

## 2021-04-05 DIAGNOSIS — C4442 Squamous cell carcinoma of skin of scalp and neck: Secondary | ICD-10-CM | POA: Diagnosis not present

## 2021-04-22 DIAGNOSIS — C4442 Squamous cell carcinoma of skin of scalp and neck: Secondary | ICD-10-CM | POA: Diagnosis not present

## 2021-05-26 DIAGNOSIS — R0902 Hypoxemia: Secondary | ICD-10-CM | POA: Diagnosis not present

## 2021-05-26 DIAGNOSIS — Z87891 Personal history of nicotine dependence: Secondary | ICD-10-CM | POA: Diagnosis not present

## 2021-05-26 DIAGNOSIS — B9789 Other viral agents as the cause of diseases classified elsewhere: Secondary | ICD-10-CM | POA: Diagnosis not present

## 2021-05-26 DIAGNOSIS — Z20822 Contact with and (suspected) exposure to covid-19: Secondary | ICD-10-CM | POA: Diagnosis not present

## 2021-05-26 DIAGNOSIS — Z79899 Other long term (current) drug therapy: Secondary | ICD-10-CM | POA: Diagnosis not present

## 2021-05-26 DIAGNOSIS — I10 Essential (primary) hypertension: Secondary | ICD-10-CM | POA: Diagnosis not present

## 2021-05-26 DIAGNOSIS — R2689 Other abnormalities of gait and mobility: Secondary | ICD-10-CM | POA: Diagnosis not present

## 2021-05-26 DIAGNOSIS — E860 Dehydration: Secondary | ICD-10-CM | POA: Diagnosis not present

## 2021-05-26 DIAGNOSIS — R112 Nausea with vomiting, unspecified: Secondary | ICD-10-CM | POA: Diagnosis not present

## 2021-05-26 DIAGNOSIS — R400 Somnolence: Secondary | ICD-10-CM | POA: Diagnosis not present

## 2021-05-26 DIAGNOSIS — R059 Cough, unspecified: Secondary | ICD-10-CM | POA: Diagnosis not present

## 2021-05-26 DIAGNOSIS — J069 Acute upper respiratory infection, unspecified: Secondary | ICD-10-CM | POA: Diagnosis not present

## 2021-05-26 DIAGNOSIS — I6522 Occlusion and stenosis of left carotid artery: Secondary | ICD-10-CM | POA: Diagnosis not present

## 2021-05-26 DIAGNOSIS — I272 Pulmonary hypertension, unspecified: Secondary | ICD-10-CM | POA: Diagnosis not present

## 2021-05-26 DIAGNOSIS — R4182 Altered mental status, unspecified: Secondary | ICD-10-CM | POA: Diagnosis not present

## 2021-05-26 DIAGNOSIS — N400 Enlarged prostate without lower urinary tract symptoms: Secondary | ICD-10-CM | POA: Diagnosis not present

## 2021-05-26 DIAGNOSIS — R0689 Other abnormalities of breathing: Secondary | ICD-10-CM | POA: Diagnosis not present

## 2021-05-26 DIAGNOSIS — R4020 Unspecified coma: Secondary | ICD-10-CM | POA: Diagnosis not present

## 2021-05-26 DIAGNOSIS — R55 Syncope and collapse: Secondary | ICD-10-CM | POA: Diagnosis not present

## 2021-05-26 DIAGNOSIS — Z8673 Personal history of transient ischemic attack (TIA), and cerebral infarction without residual deficits: Secondary | ICD-10-CM | POA: Diagnosis not present

## 2021-05-27 DIAGNOSIS — I700 Atherosclerosis of aorta: Secondary | ICD-10-CM | POA: Diagnosis not present

## 2021-05-27 DIAGNOSIS — R55 Syncope and collapse: Secondary | ICD-10-CM | POA: Diagnosis not present

## 2021-05-27 DIAGNOSIS — I10 Essential (primary) hypertension: Secondary | ICD-10-CM | POA: Diagnosis not present

## 2021-05-27 DIAGNOSIS — J90 Pleural effusion, not elsewhere classified: Secondary | ICD-10-CM | POA: Diagnosis not present

## 2021-05-27 DIAGNOSIS — N400 Enlarged prostate without lower urinary tract symptoms: Secondary | ICD-10-CM | POA: Diagnosis not present

## 2021-05-27 DIAGNOSIS — I6381 Other cerebral infarction due to occlusion or stenosis of small artery: Secondary | ICD-10-CM | POA: Diagnosis not present

## 2021-05-27 DIAGNOSIS — G9389 Other specified disorders of brain: Secondary | ICD-10-CM | POA: Diagnosis not present

## 2021-05-27 DIAGNOSIS — J180 Bronchopneumonia, unspecified organism: Secondary | ICD-10-CM | POA: Diagnosis not present

## 2021-05-27 DIAGNOSIS — G319 Degenerative disease of nervous system, unspecified: Secondary | ICD-10-CM | POA: Diagnosis not present

## 2021-05-27 DIAGNOSIS — J1289 Other viral pneumonia: Secondary | ICD-10-CM | POA: Diagnosis not present

## 2021-05-28 DIAGNOSIS — I7789 Other specified disorders of arteries and arterioles: Secondary | ICD-10-CM | POA: Diagnosis not present

## 2021-05-28 DIAGNOSIS — N400 Enlarged prostate without lower urinary tract symptoms: Secondary | ICD-10-CM | POA: Diagnosis not present

## 2021-05-28 DIAGNOSIS — J209 Acute bronchitis, unspecified: Secondary | ICD-10-CM | POA: Diagnosis not present

## 2021-05-28 DIAGNOSIS — J180 Bronchopneumonia, unspecified organism: Secondary | ICD-10-CM | POA: Diagnosis not present

## 2021-05-28 DIAGNOSIS — M1990 Unspecified osteoarthritis, unspecified site: Secondary | ICD-10-CM | POA: Diagnosis not present

## 2021-05-28 DIAGNOSIS — I6782 Cerebral ischemia: Secondary | ICD-10-CM | POA: Diagnosis not present

## 2021-05-28 DIAGNOSIS — I10 Essential (primary) hypertension: Secondary | ICD-10-CM | POA: Diagnosis not present

## 2021-05-28 DIAGNOSIS — R55 Syncope and collapse: Secondary | ICD-10-CM | POA: Diagnosis not present

## 2021-06-03 DIAGNOSIS — Z23 Encounter for immunization: Secondary | ICD-10-CM | POA: Diagnosis not present

## 2021-06-17 DIAGNOSIS — J189 Pneumonia, unspecified organism: Secondary | ICD-10-CM | POA: Diagnosis not present

## 2021-06-17 DIAGNOSIS — J209 Acute bronchitis, unspecified: Secondary | ICD-10-CM | POA: Diagnosis not present

## 2021-06-17 DIAGNOSIS — G4700 Insomnia, unspecified: Secondary | ICD-10-CM | POA: Diagnosis not present

## 2021-06-17 DIAGNOSIS — E119 Type 2 diabetes mellitus without complications: Secondary | ICD-10-CM | POA: Diagnosis not present

## 2021-06-17 DIAGNOSIS — J440 Chronic obstructive pulmonary disease with acute lower respiratory infection: Secondary | ICD-10-CM | POA: Diagnosis not present

## 2021-06-17 DIAGNOSIS — J441 Chronic obstructive pulmonary disease with (acute) exacerbation: Secondary | ICD-10-CM | POA: Diagnosis not present

## 2021-06-17 DIAGNOSIS — K219 Gastro-esophageal reflux disease without esophagitis: Secondary | ICD-10-CM | POA: Diagnosis not present

## 2021-06-17 DIAGNOSIS — I10 Essential (primary) hypertension: Secondary | ICD-10-CM | POA: Diagnosis not present

## 2021-06-27 DIAGNOSIS — R059 Cough, unspecified: Secondary | ICD-10-CM | POA: Diagnosis not present

## 2021-06-27 DIAGNOSIS — R55 Syncope and collapse: Secondary | ICD-10-CM | POA: Diagnosis not present

## 2021-06-27 DIAGNOSIS — J189 Pneumonia, unspecified organism: Secondary | ICD-10-CM | POA: Diagnosis not present

## 2021-06-27 DIAGNOSIS — B348 Other viral infections of unspecified site: Secondary | ICD-10-CM | POA: Diagnosis not present

## 2021-06-27 DIAGNOSIS — R5383 Other fatigue: Secondary | ICD-10-CM | POA: Diagnosis not present

## 2021-07-01 DIAGNOSIS — I10 Essential (primary) hypertension: Secondary | ICD-10-CM | POA: Diagnosis not present

## 2021-07-01 DIAGNOSIS — N401 Enlarged prostate with lower urinary tract symptoms: Secondary | ICD-10-CM | POA: Diagnosis not present

## 2021-07-01 DIAGNOSIS — Z6821 Body mass index (BMI) 21.0-21.9, adult: Secondary | ICD-10-CM | POA: Diagnosis not present

## 2021-07-01 DIAGNOSIS — E782 Mixed hyperlipidemia: Secondary | ICD-10-CM | POA: Diagnosis not present

## 2021-07-01 DIAGNOSIS — E7849 Other hyperlipidemia: Secondary | ICD-10-CM | POA: Diagnosis not present

## 2021-07-01 DIAGNOSIS — R5383 Other fatigue: Secondary | ICD-10-CM | POA: Diagnosis not present

## 2021-07-01 DIAGNOSIS — R0609 Other forms of dyspnea: Secondary | ICD-10-CM | POA: Diagnosis not present

## 2021-07-01 DIAGNOSIS — R972 Elevated prostate specific antigen [PSA]: Secondary | ICD-10-CM | POA: Diagnosis not present

## 2021-07-01 DIAGNOSIS — M545 Low back pain, unspecified: Secondary | ICD-10-CM | POA: Diagnosis not present

## 2021-07-01 DIAGNOSIS — J189 Pneumonia, unspecified organism: Secondary | ICD-10-CM | POA: Diagnosis not present

## 2021-10-01 DIAGNOSIS — R972 Elevated prostate specific antigen [PSA]: Secondary | ICD-10-CM | POA: Diagnosis not present

## 2021-10-01 DIAGNOSIS — R0609 Other forms of dyspnea: Secondary | ICD-10-CM | POA: Diagnosis not present

## 2021-10-01 DIAGNOSIS — N401 Enlarged prostate with lower urinary tract symptoms: Secondary | ICD-10-CM | POA: Diagnosis not present

## 2021-10-01 DIAGNOSIS — M545 Low back pain, unspecified: Secondary | ICD-10-CM | POA: Diagnosis not present

## 2021-10-01 DIAGNOSIS — I10 Essential (primary) hypertension: Secondary | ICD-10-CM | POA: Diagnosis not present

## 2021-10-01 DIAGNOSIS — R5383 Other fatigue: Secondary | ICD-10-CM | POA: Diagnosis not present

## 2021-10-01 DIAGNOSIS — E7849 Other hyperlipidemia: Secondary | ICD-10-CM | POA: Diagnosis not present

## 2021-10-01 DIAGNOSIS — I700 Atherosclerosis of aorta: Secondary | ICD-10-CM | POA: Diagnosis not present

## 2021-12-20 DIAGNOSIS — B078 Other viral warts: Secondary | ICD-10-CM | POA: Diagnosis not present

## 2021-12-23 DIAGNOSIS — E7801 Familial hypercholesterolemia: Secondary | ICD-10-CM | POA: Diagnosis not present

## 2021-12-23 DIAGNOSIS — E559 Vitamin D deficiency, unspecified: Secondary | ICD-10-CM | POA: Diagnosis not present

## 2021-12-23 DIAGNOSIS — Z125 Encounter for screening for malignant neoplasm of prostate: Secondary | ICD-10-CM | POA: Diagnosis not present

## 2021-12-23 DIAGNOSIS — R5383 Other fatigue: Secondary | ICD-10-CM | POA: Diagnosis not present

## 2021-12-23 DIAGNOSIS — E7849 Other hyperlipidemia: Secondary | ICD-10-CM | POA: Diagnosis not present

## 2021-12-23 DIAGNOSIS — I10 Essential (primary) hypertension: Secondary | ICD-10-CM | POA: Diagnosis not present

## 2021-12-23 DIAGNOSIS — Z1329 Encounter for screening for other suspected endocrine disorder: Secondary | ICD-10-CM | POA: Diagnosis not present

## 2021-12-23 DIAGNOSIS — E7800 Pure hypercholesterolemia, unspecified: Secondary | ICD-10-CM | POA: Diagnosis not present

## 2021-12-23 DIAGNOSIS — E782 Mixed hyperlipidemia: Secondary | ICD-10-CM | POA: Diagnosis not present

## 2021-12-27 DIAGNOSIS — R0609 Other forms of dyspnea: Secondary | ICD-10-CM | POA: Diagnosis not present

## 2021-12-27 DIAGNOSIS — R972 Elevated prostate specific antigen [PSA]: Secondary | ICD-10-CM | POA: Diagnosis not present

## 2021-12-27 DIAGNOSIS — I700 Atherosclerosis of aorta: Secondary | ICD-10-CM | POA: Diagnosis not present

## 2021-12-27 DIAGNOSIS — R5383 Other fatigue: Secondary | ICD-10-CM | POA: Diagnosis not present

## 2021-12-27 DIAGNOSIS — Z6820 Body mass index (BMI) 20.0-20.9, adult: Secondary | ICD-10-CM | POA: Diagnosis not present

## 2021-12-27 DIAGNOSIS — N401 Enlarged prostate with lower urinary tract symptoms: Secondary | ICD-10-CM | POA: Diagnosis not present

## 2021-12-27 DIAGNOSIS — E7849 Other hyperlipidemia: Secondary | ICD-10-CM | POA: Diagnosis not present

## 2021-12-27 DIAGNOSIS — I10 Essential (primary) hypertension: Secondary | ICD-10-CM | POA: Diagnosis not present

## 2022-01-06 ENCOUNTER — Ambulatory Visit: Payer: PPO | Admitting: Urology

## 2022-01-06 ENCOUNTER — Encounter: Payer: Self-pay | Admitting: Urology

## 2022-01-06 VITALS — BP 184/69 | HR 71 | Ht 71.0 in | Wt 146.6 lb

## 2022-01-06 DIAGNOSIS — N403 Nodular prostate with lower urinary tract symptoms: Secondary | ICD-10-CM

## 2022-01-06 DIAGNOSIS — C61 Malignant neoplasm of prostate: Secondary | ICD-10-CM

## 2022-01-06 DIAGNOSIS — R3912 Poor urinary stream: Secondary | ICD-10-CM

## 2022-01-06 DIAGNOSIS — R972 Elevated prostate specific antigen [PSA]: Secondary | ICD-10-CM

## 2022-01-06 LAB — URINALYSIS, ROUTINE W REFLEX MICROSCOPIC
Bilirubin, UA: NEGATIVE
Glucose, UA: NEGATIVE
Ketones, UA: NEGATIVE
Leukocytes,UA: NEGATIVE
Nitrite, UA: NEGATIVE
Protein,UA: NEGATIVE
RBC, UA: NEGATIVE
Specific Gravity, UA: >1.03 — ABNORMAL HIGH (ref 1.005–1.030)
Urobilinogen, Ur: 0.2 mg/dL (ref 0.2–1.0)
pH, UA: 5.5 (ref 5.0–7.5)

## 2022-01-06 NOTE — Progress Notes (Signed)
Subjective: 1. Elevated PSA   2. Nodular prostate with lower urinary tract symptoms   3. Weak urinary stream   4. Prostate cancer ([place])     01/06/22: Andrew [name] returns today at the request of Dr. Quintin Alto for a further rise in his PSA to 67.  He has had progressive LUTS with a reduced stream with frequency  and urgency.  He has gotten progressively weak and is down 3-4 lbs.  He has no bone pain but has arthritis.   He has had no hematuria or dysuria.  He is on Vit D for a low level and he had a slight anemia.    04/10/20: Andrew [name] returns today in f/u for his history of nocturia.   He reports that the nocturia is down to 3-4x from 4-5x. He didn't stop the coke in the evenings and drinks a lot of tea in the daytime.  His IPSS is 11.   He did a voiding diary and >50% of his UOP is at night.  His labs in July from Dr. Quintin Alto had a Cr of 0.9 and a sodium of 137.   His PSA was up to 9.9 from 6.7 over the past year.  He thinks it was about a 5 prior to that.   He has a reduced stream but is emptying well with a PVR of about 10m.    02/21/20: Andrew [name] an 88yo male who is sent by Dr. SQuintin Altofor BPH with BOO.   He has nocturia q2-3hrs.  He can have some hesitancy.  He doesn't feel he empties completely and has some intermittency.  He has no issues with urgency.  He has no daytime frequency.  He has a reduced stream.  He was given tamsulosin in the past for nocturia but it  made him dizzy so he stopped it after just a few days.   He has had no UTI's.  He has had no stones or GU surgery.    He was unable to get a urine today.  ROS:  Review of Systems  Constitutional:  Positive for malaise/fatigue.  Neurological:  Positive for weakness.  Endo/Heme/Allergies:  Bruises/bleeds easily.  All other systems reviewed and are negative.  No Known Allergies  Past Medical History:  Diagnosis Date   Arthritis    BPH (benign prostatic hyperplasia)    Carotid artery stenosis    a. s/p L CEA in 03/2017   Hypertension      Past Surgical History:  Procedure Laterality Date   CAROTID ENDARTERECTOMY Left 04/17/2017   COLONOSCOPY N/A 01/19/2017   Procedure: COLONOSCOPY;  Surgeon: RDaneil Dolin MD;  [facility];  Service: Endoscopy;  Laterality: N/A;  1:15pm   ENDARTERECTOMY Left 04/17/2017   Procedure: ENDARTERECTOMY CAROTID - LEFT;  Surgeon: CConrad Minnetrista MD;  [facility]  Service: Vascular;  Laterality: Left;   ESOPHAGOGASTRODUODENOSCOPY N/A 01/19/2017   Procedure: ESOPHAGOGASTRODUODENOSCOPY (EGD);  Surgeon: RDaneil Dolin MD;  [facility];  Service: Endoscopy;  Laterality: N/A;   PATCH ANGIOPLASTY Left 04/17/2017   Procedure: PATCH ANGIOPLASTY;  Surgeon: CConrad Streamwood MD;  [facility]  Service: Vascular;  Laterality: Left;    Social History   Socioeconomic History   Marital status: Married    Spouse name: Not on file   Number of children: 1   Years of education: Not on file   Highest education level: Not on file  Occupational History   Occupation: retired  Tobacco Use  Smoking status: Former    Years: 30.00    Types: Cigarettes    Quit date: 1980    Years since quitting: 43.4   Smokeless tobacco: Never  Vaping Use   Vaping Use: Never used  Substance and Sexual Activity   Alcohol use: No   Drug use: No   Sexual activity: Never  Other Topics Concern   Not on file  Social History Narrative   Not on file   Social Determinants of Health   Financial Resource Strain: Not on file  Food Insecurity: Not on file  Transportation Needs: Not on file  Physical Activity: Not on file  Stress: Not on file  Social Connections: Not on file  Intimate Partner Violence: Not on file    Family History  Problem Relation Age of Onset   Hypertension Father    Colon cancer Neg Hx    Colon polyps Neg Hx     Anti-infectives: Anti-infectives (From admission, onward)    None       Current Outpatient Medications  Medication Sig Dispense Refill    acetaminophen (TYLENOL) 500 MG tablet Take 1,000 mg by mouth 2 (two) times daily as needed for moderate pain.     losartan (COZAAR) 25 MG tablet Take 25 mg by mouth daily.     VITAMIN D PO Take by mouth.     No current facility-administered medications for this visit.     Objective: Vital signs in last 24 hours: BP (!) 184/69 (BP Location: Left Arm, Patient Position: Sitting, Cuff Size: Normal)   Pulse 71   Ht '5\' 11"'$  (1.803 m)   Wt 146 lb 9.6 oz (66.5 kg)   BMI 20.45 kg/m   Intake/Output from previous day: No intake/output data recorded. Intake/Output this shift: '@IOTHISSHIFT'$ @   Physical Exam Vitals reviewed.  Constitutional:      Comments: thin  Cardiovascular:     Rate and Rhythm: Normal rate and regular rhythm.     Heart sounds: Normal heart sounds.  Pulmonary:     Effort: Pulmonary effort is normal.     Breath sounds: Normal breath sounds.  Abdominal:     General: Abdomen is flat.     Palpations: Abdomen is soft. There is no mass.     Tenderness: There is no abdominal tenderness.     Hernia: No hernia is present.  Genitourinary:    Comments: Normal phallus with adequate meatus. Scrotum normal. Testes mildly atrophied.Marland Kitchen Epididymis normal. AP without lesions, NST without mass. Prostate 1.5+ firm on right. SV non-palpable.  Musculoskeletal:        General: No swelling or tenderness. Normal range of motion.  Lymphadenopathy:     Cervical: No cervical adenopathy.     Upper Body:     Right upper body: No supraclavicular or axillary adenopathy.     Left upper body: No supraclavicular or axillary adenopathy.     Lower Body: No right inguinal adenopathy. No left inguinal adenopathy.  Skin:    General: Skin is warm and dry.  Neurological:     General: No focal deficit present.     Mental Status: He is alert and oriented to person, place, and time.  Psychiatric:        Mood and Affect: Mood normal.        Behavior: Behavior normal.    Lab Results:  Results  for orders placed or performed in visit on 01/06/22 (from the past 24 hour(s))  Urinalysis, Routine w reflex microscopic  Status: Abnormal   Collection Time: 01/06/22  2:46 PM  Result Value Ref Range   Specific Gravity, UA >1.030 (H) 1.005 - 1.030   pH, UA 5.5 5.0 - 7.5   Color, UA Yellow Yellow   Appearance Ur Clear Clear   Leukocytes,UA Negative Negative   Protein,UA Negative Negative/Trace   Glucose, UA Negative Negative   Ketones, UA Negative Negative   RBC, UA Negative Negative   Bilirubin, UA Negative Negative   Urobilinogen, Ur 0.2 0.2 - 1.0 mg/dL   Nitrite, UA Negative Negative   Microscopic Examination Comment    Narrative   Performed at:  [facility] [address], [place], [place]  [id number] Lab Director: [place], Phone:  [phone]    BMET No results for input(s): NA, K, CL, CO2, GLUCOSE, BUN, CREATININE, CALCIUM in the last 72 hours. PT/INR No results for input(s): LABPROT, INR in the last 72 hours. ABG No results for input(s): PHART, HCO3 in the last 72 hours.  Invalid input(s): PCO2, PO2  Studies/Results: I have reviewed labs from Dr. Quintin Alto including the CMP, CBC and PSA's. Marland Kitchen   PVR is 45m   Assessment/Plan: 1.  Elevated PSA with right prostate induration.   With the PSA of 35 and prostate induration, he has prostate cancer until proven otherwise.   At 89he is not a great candidate for local curative therapy so the primarily indication to treat would be if he has metastatic disease contributing to his current fatigue and decline.   [facility] which would be the most accurate and least invasive way to determine if he has metastatic disease.   I will arrange f/u based on the results.    No orders of the defined types were placed in this encounter.    Orders Placed This Encounter  Procedures   NM PET (PSMA) SKULL TO MID THIGH    He is 89with a PSA of 35 and prostate nodule with the  clinical diagnosis of prostate cancer.  I want to assess for mets as a biopsy may not be needed if the PET is positive.    Standing Status:   Future    Standing Expiration Date:   01/07/2023    Order Specific Question:   If indicated for the ordered procedure, I authorize the administration of a radiopharmaceutical per Radiology protocol    Answer:   Yes    Order Specific Question:   [facility]    [facility]   Urinalysis, Routine w reflex microscopic   PSA   Testosterone     Return for I will arrange f/u based on the PET scan results. .    CC: Dr. PConsuello Masse      JIrine Seal5/18/2023 [phone]

## 2022-01-07 LAB — PSA: Prostate Specific Ag, Serum: 39.8 ng/mL — ABNORMAL HIGH (ref 0.0–4.0)

## 2022-01-07 LAB — TESTOSTERONE: Testosterone: 206 ng/dL — ABNORMAL LOW (ref 264–916)

## 2022-01-10 ENCOUNTER — Telehealth: Payer: Self-pay

## 2022-01-10 NOTE — Telephone Encounter (Signed)
Detailed message left for patient of recommendations and to return call back to office.

## 2022-01-10 NOTE — Telephone Encounter (Signed)
-----   Message from Irine Seal, MD sent at 01/07/2022 11:56 AM EDT ----- His PSA is up further to 39 and his testosterone is a little low at 209 with normal being above 300 but it is not so low that he wouldn't respond to further suppression of the testosterone.  Continue with current plan to get the PET scan.  ----- Message ----- From: Dorisann Frames, RN Sent: 01/07/2022  10:43 AM EDT To: Irine Seal, MD  Please review

## 2022-01-11 NOTE — Telephone Encounter (Signed)
Daughter returned call to office. Notified of Dr. Jeffie Pollock message. Voiced understanding.

## 2022-01-20 ENCOUNTER — Encounter (HOSPITAL_COMMUNITY)
Admission: RE | Admit: 2022-01-20 | Discharge: 2022-01-20 | Disposition: A | Discharge status: Home / Self Care | Payer: PPO | Source: Ambulatory Visit | Attending: Urology | Admitting: Urology

## 2022-01-20 DIAGNOSIS — N403 Nodular prostate with lower urinary tract symptoms: Secondary | ICD-10-CM | POA: Diagnosis not present

## 2022-01-20 DIAGNOSIS — K5730 Diverticulosis of large intestine without perforation or abscess without bleeding: Secondary | ICD-10-CM | POA: Diagnosis not present

## 2022-01-20 DIAGNOSIS — I700 Atherosclerosis of aorta: Secondary | ICD-10-CM | POA: Diagnosis not present

## 2022-01-20 DIAGNOSIS — I2510 Atherosclerotic heart disease of native coronary artery without angina pectoris: Secondary | ICD-10-CM | POA: Diagnosis not present

## 2022-01-20 DIAGNOSIS — R972 Elevated prostate specific antigen [PSA]: Secondary | ICD-10-CM | POA: Diagnosis not present

## 2022-01-20 DIAGNOSIS — C61 Malignant neoplasm of prostate: Secondary | ICD-10-CM | POA: Diagnosis not present

## 2022-01-20 MED ORDER — PIFLIFOLASTAT F 18 (PYLARIFY) INJECTION
9.0000 | Freq: Once | INTRAVENOUS | Status: AC
  Administered 2022-01-20: 8.86 via INTRAVENOUS

## 2022-01-24 ENCOUNTER — Telehealth: Payer: Self-pay

## 2022-01-24 DIAGNOSIS — R972 Elevated prostate specific antigen [PSA]: Secondary | ICD-10-CM

## 2022-01-24 NOTE — Telephone Encounter (Signed)
-----   Message from Irine Seal, MD sent at 01/22/2022  8:41 AM EDT ----- The PET scan shows only prostate uptake and no evidence of metastases.   I don't think the prostate cancer is the cause of his symptoms since it appears to be locally confined, but he should return to see me in 3 months with a PSA so we can gauge the progression of the prostate cancer.    ----- Message ----- From: Dorisann Frames, RN Sent: 01/21/2022   3:20 PM EDT To: Irine Seal, MD  Please review

## 2022-01-24 NOTE — Telephone Encounter (Signed)
Patient and wife notified. Voiced understanding. Appts mailed to patient.

## 2022-01-25 ENCOUNTER — Telehealth: Payer: Self-pay

## 2022-01-25 NOTE — Telephone Encounter (Signed)
Daughter called with some concerns with waiting until September for his next apt.  She is concerned that by that time the cancer will have spread and wants to know if any treatment will be started before that apt or what the next steps are.  I informed her of your response to his PET scan but she was under the impression that he would begin treatment for the cancer after having the scan.  Please advise.  She also gave her number if you wanted to contact her with options.  [phone]

## 2022-05-03 ENCOUNTER — Other Ambulatory Visit: Payer: PPO

## 2022-05-03 DIAGNOSIS — R972 Elevated prostate specific antigen [PSA]: Secondary | ICD-10-CM

## 2022-05-05 LAB — PSA: Prostate Specific Ag, Serum: 61.4 ng/mL — ABNORMAL HIGH (ref 0.0–4.0)

## 2022-05-12 ENCOUNTER — Ambulatory Visit: Payer: PPO | Admitting: Urology

## 2022-05-12 VITALS — BP 155/68 | HR 88

## 2022-05-12 DIAGNOSIS — N403 Nodular prostate with lower urinary tract symptoms: Secondary | ICD-10-CM | POA: Diagnosis not present

## 2022-05-12 DIAGNOSIS — R3912 Poor urinary stream: Secondary | ICD-10-CM

## 2022-05-12 DIAGNOSIS — C61 Malignant neoplasm of prostate: Secondary | ICD-10-CM | POA: Diagnosis not present

## 2022-05-12 DIAGNOSIS — R972 Elevated prostate specific antigen [PSA]: Secondary | ICD-10-CM | POA: Diagnosis not present

## 2022-05-12 DIAGNOSIS — E291 Testicular hypofunction: Secondary | ICD-10-CM

## 2022-05-12 NOTE — Progress Notes (Signed)
Subjective: 1. Elevated PSA   2. Prostate cancer ([place])   3. Nodular prostate with lower urinary tract symptoms   4. Weak urinary stream   5. Hypogonadism in male     05/12/22: Andrew [name] returns today in f/u.  His PSA is up to 61.4 on 05/03/22 from 39.8 onm 01/06/22.  He had a PET scan on 01/20/22 that showed only prostate uptake and no mets.  He continues to void ok with an IPSS of 14.  He has nocturia about 4x night.  His weight is stable.  He has had no bone pain.  He remains fairly feeble.   5/18/23[name] returns today at the request of Dr. Quintin Alto for a further rise in his PSA to 18.  He has had progressive LUTS with a reduced stream with frequency  and urgency.  He has gotten progressively weak and is down 3-4 lbs.  He has no bone pain but has arthritis.   He has had no hematuria or dysuria.  He is on Vit D for a low level and he had a slight anemia.    04/10/20: Andrew [name] returns today in f/u for his history of nocturia.   He reports that the nocturia is down to 3-4x from 4-5x. He didn't stop the coke in the evenings and drinks a lot of tea in the daytime.  His IPSS is 11.   He did a voiding diary and >50% of his UOP is at night.  His labs in July from Dr. Quintin Alto had a Cr of 0.9 and a sodium of 137.   His PSA was up to 9.9 from 6.7 over the past year.  He thinks it was about a 5 prior to that.   He has a reduced stream but is emptying well with a PVR of about 16m.    02/21/20: Andrew [name] an 88yo male who is sent by Dr. SQuintin Altofor BPH with BOO.   He has nocturia q2-3hrs.  He can have some hesitancy.  He doesn't feel he empties completely and has some intermittency.  He has no issues with urgency.  He has no daytime frequency.  He has a reduced stream.  He was given tamsulosin in the past for nocturia but it  made him dizzy so he stopped it after just a few days.   He has had no UTI's.  He has had no stones or GU surgery.    He was unable to get a urine today.  ROS:  Review of Systems  Constitutional:   Positive for malaise/fatigue.  Neurological:  Positive for weakness.  Endo/Heme/Allergies:  Bruises/bleeds easily.  All other systems reviewed and are negative.   No Known Allergies  Past Medical History:  Diagnosis Date   Arthritis    BPH (benign prostatic hyperplasia)    Carotid artery stenosis    a. s/p L CEA in 03/2017   Hypertension     Past Surgical History:  Procedure Laterality Date   CAROTID ENDARTERECTOMY Left 04/17/2017   COLONOSCOPY N/A 01/19/2017   Procedure: COLONOSCOPY;  Surgeon: RDaneil Dolin MD;  [facility];  Service: Endoscopy;  Laterality: N/A;  1:15pm   ENDARTERECTOMY Left 04/17/2017   Procedure: ENDARTERECTOMY CAROTID - LEFT;  Surgeon: CConrad Alma MD;  [facility]  Service: Vascular;  Laterality: Left;   ESOPHAGOGASTRODUODENOSCOPY N/A 01/19/2017   Procedure: ESOPHAGOGASTRODUODENOSCOPY (EGD);  Surgeon: RDaneil Dolin MD;  [facility];  Service: Endoscopy;  Laterality: N/A;   PATCH  ANGIOPLASTY Left 04/17/2017   Procedure: PATCH ANGIOPLASTY;  Surgeon: Conrad Sebring, MD;  Location: [facility] OR;  Service: Vascular;  Laterality: Left;    Social History   Socioeconomic History   Marital status: Married    Spouse name: Not on file   Number of children: 1   Years of education: Not on file   Highest education level: Not on file  Occupational History   Occupation: retired  Tobacco Use   Smoking status: Former    Years: 30.00    Types: Cigarettes    Quit date: 1980    Years since quitting: 43.7   Smokeless tobacco: Never  Vaping Use   Vaping Use: Never used  Substance and Sexual Activity   Alcohol use: No   Drug use: No   Sexual activity: Never  Other Topics Concern   Not on file  Social History Narrative   Not on file   Social Determinants of Health   Financial Resource Strain: Not on file  Food Insecurity: Not on file  Transportation Needs: Not on file  Physical Activity: Not on file  Stress: Not on file  Social  Connections: Not on file  Intimate Partner Violence: Not on file    Family History  Problem Relation Age of Onset   Hypertension Father    Colon cancer Neg Hx    Colon polyps Neg Hx     Anti-infectives: Anti-infectives (From admission, onward)    None       Current Outpatient Medications  Medication Sig Dispense Refill   acetaminophen (TYLENOL) 500 MG tablet Take 1,000 mg by mouth 2 (two) times daily as needed for moderate pain.     losartan (COZAAR) 25 MG tablet Take 25 mg by mouth daily.     losartan (COZAAR) 50 MG tablet Take 50 mg by mouth daily.     traMADol (ULTRAM) 50 MG tablet Take 50 mg by mouth every 6 (six) hours as needed.     VITAMIN D PO Take by mouth.     No current facility-administered medications for this visit.     Objective: Vital signs in last 24 hours: BP (!) 155/68   Pulse 88   Intake/Output from previous day: No intake/output data recorded. Intake/Output this shift: '@IOTHISSHIFT'$ @   Physical Exam Vitals reviewed.  Constitutional:      Appearance: Normal appearance.  Lymphadenopathy:     Cervical: No cervical adenopathy.     Upper Body:     Right upper body: No supraclavicular or axillary adenopathy.     Left upper body: No supraclavicular or axillary adenopathy.  Neurological:     General: No focal deficit present.     Mental Status: He is alert and oriented to person, place, and time.     Comments: He is unsteady on his feet.      Lab Results:  No results found for this or any previous visit (from the past 24 hour(s)).   BMET No results for input(s): "NA", "K", "CL", "CO2", "GLUCOSE", "BUN", "CREATININE", "CALCIUM" in the last 72 hours. PT/INR No results for input(s): "LABPROT", "INR" in the last 72 hours. ABG No results for input(s): "PHART", "HCO3" in the last 72 hours.  Invalid input(s): "PCO2", "PO2"  Studies/Results: PSMA PET report and films reviewed.    Assessment/Plan: Elevated PSA with right prostate  induration and uptake in the prostate on the PSMA PET which is consistent with a localized prostate cancer.   His PSA is up further to 61 but  he remains minimally symptomatic and has stable LUTS.    I will have him return for labs in 6 weeks and in 3 months.  I will consider a repeat PET if the PSA has increased further with his next labs.     He is not a great candidate for local curative therapy so the primarily indication to treat would be if he has metastatic disease contributing to his current fatigue and decline.     Hypogonadism.   His T was 206 in May.   I will repeat in 6 weeks.   Nodular prostate with LUTS.   He is voiding with stable symptoms.  No orders of the defined types were placed in this encounter.    Orders Placed This Encounter  Procedures   Urinalysis, Routine w reflex microscopic   PSA    Standing Status:   Future    Standing Expiration Date:   08/11/2022   Testosterone    Standing Status:   Future    Standing Expiration Date:   08/11/2022   PSA    Standing Status:   Future    Standing Expiration Date:   11/10/2022     Return in about 3 months (around 08/11/2022) for with PSA result. .    CC: Dr. Consuello Masse.      Irine Seal 05/13/2022 [phone]

## 2022-05-13 ENCOUNTER — Encounter: Payer: Self-pay | Admitting: Urology

## 2022-06-16 ENCOUNTER — Other Ambulatory Visit: Payer: PPO

## 2022-06-16 DIAGNOSIS — R972 Elevated prostate specific antigen [PSA]: Secondary | ICD-10-CM

## 2022-06-16 DIAGNOSIS — C61 Malignant neoplasm of prostate: Secondary | ICD-10-CM

## 2022-06-17 LAB — PSA: Prostate Specific Ag, Serum: 65.9 ng/mL — ABNORMAL HIGH (ref 0.0–4.0)

## 2022-06-17 LAB — TESTOSTERONE: Testosterone: 228 ng/dL — ABNORMAL LOW (ref 264–916)

## 2022-06-19 DIAGNOSIS — S01112A Laceration without foreign body of left eyelid and periocular area, initial encounter: Secondary | ICD-10-CM | POA: Diagnosis not present

## 2022-06-19 DIAGNOSIS — R58 Hemorrhage, not elsewhere classified: Secondary | ICD-10-CM | POA: Diagnosis not present

## 2022-06-19 DIAGNOSIS — W19XXXA Unspecified fall, initial encounter: Secondary | ICD-10-CM | POA: Diagnosis not present

## 2022-06-19 DIAGNOSIS — I491 Atrial premature depolarization: Secondary | ICD-10-CM | POA: Diagnosis not present

## 2022-06-19 DIAGNOSIS — R102 Pelvic and perineal pain: Secondary | ICD-10-CM | POA: Diagnosis not present

## 2022-06-19 DIAGNOSIS — E860 Dehydration: Secondary | ICD-10-CM | POA: Diagnosis not present

## 2022-06-19 DIAGNOSIS — W1839XA Other fall on same level, initial encounter: Secondary | ICD-10-CM | POA: Diagnosis not present

## 2022-06-19 DIAGNOSIS — R55 Syncope and collapse: Secondary | ICD-10-CM | POA: Diagnosis not present

## 2022-06-19 DIAGNOSIS — Z87891 Personal history of nicotine dependence: Secondary | ICD-10-CM | POA: Diagnosis not present

## 2022-06-19 DIAGNOSIS — R0689 Other abnormalities of breathing: Secondary | ICD-10-CM | POA: Diagnosis not present

## 2022-06-19 DIAGNOSIS — S0181XA Laceration without foreign body of other part of head, initial encounter: Secondary | ICD-10-CM | POA: Diagnosis not present

## 2022-06-23 ENCOUNTER — Ambulatory Visit (INDEPENDENT_AMBULATORY_CARE_PROVIDER_SITE_OTHER): Payer: PPO | Admitting: Urology

## 2022-06-23 ENCOUNTER — Encounter: Payer: Self-pay | Admitting: Urology

## 2022-06-23 VITALS — BP 173/66 | HR 84

## 2022-06-23 DIAGNOSIS — C61 Malignant neoplasm of prostate: Secondary | ICD-10-CM | POA: Diagnosis not present

## 2022-06-23 DIAGNOSIS — R972 Elevated prostate specific antigen [PSA]: Secondary | ICD-10-CM

## 2022-06-23 DIAGNOSIS — R3912 Poor urinary stream: Secondary | ICD-10-CM | POA: Diagnosis not present

## 2022-06-23 DIAGNOSIS — R351 Nocturia: Secondary | ICD-10-CM | POA: Diagnosis not present

## 2022-06-23 DIAGNOSIS — N403 Nodular prostate with lower urinary tract symptoms: Secondary | ICD-10-CM | POA: Diagnosis not present

## 2022-06-23 DIAGNOSIS — E291 Testicular hypofunction: Secondary | ICD-10-CM | POA: Diagnosis not present

## 2022-06-23 NOTE — Progress Notes (Signed)
Subjective: 1. Elevated PSA   2. Prostate cancer ([place])   3. Nodular prostate with lower urinary tract symptoms   4. Hypogonadism in male   5. Weak urinary stream   6. Nocturia     06/23/22: [name] returns today in f/u for his clinical diagnosis of prostate cancer with prostate uptake and no mets on a PSMA PET on 01/20/22.  His PSA has been rising and was 65.9 on 06/16/22 which was up from 61.4 in 9/23 and 39.8 in 5/23.  He remains weak and fell recently and bruised his left eye brow.  His weight is down slightly.  He has no bone pain but has some shoulder arthritis and low back pain.  He thinks he is voiding better with an IPSS of 11 and nocturia x 3. .    05/12/22: Andrew [name] returns today in f/u.  His PSA is up to 61.4 on 05/03/22 from 39.8 onm 01/06/22.  He had a PET scan on 01/20/22 that showed only prostate uptake and no mets.  He continues to void ok with an IPSS of 14.  He has nocturia about 4x night.  His weight is stable.  He has had no bone pain.  He remains fairly feeble.   5/18/23[name] returns today at the request of Dr. Quintin Alto for a further rise in his PSA to 10.  He has had progressive LUTS with a reduced stream with frequency  and urgency.  He has gotten progressively weak and is down 3-4 lbs.  He has no bone pain but has arthritis.   He has had no hematuria or dysuria.  He is on Vit D for a low level and he had a slight anemia.    04/10/20: Andrew [name] returns today in f/u for his history of nocturia.   He reports that the nocturia is down to 3-4x from 4-5x. He didn't stop the coke in the evenings and drinks a lot of tea in the daytime.  His IPSS is 11.   He did a voiding diary and >50% of his UOP is at night.  His labs in July from Dr. Quintin Alto had a Cr of 0.9 and a sodium of 137.   His PSA was up to 9.9 from 6.7 over the past year.  He thinks it was about a 5 prior to that.   He has a reduced stream but is emptying well with a PVR of about 72m.    02/21/20: Andrew [name] an 88yo male who is sent by Dr.  SQuintin Altofor BPH with BOO.   He has nocturia q2-3hrs.  He can have some hesitancy.  He doesn't feel he empties completely and has some intermittency.  He has no issues with urgency.  He has no daytime frequency.  He has a reduced stream.  He was given tamsulosin in the past for nocturia but it  made him dizzy so he stopped it after just a few days.   He has had no UTI's.  He has had no stones or GU surgery.    He was unable to get a urine today.  ROS:  Review of Systems  Constitutional:  Positive for malaise/fatigue.  Neurological:  Positive for weakness.  Endo/Heme/Allergies:  Bruises/bleeds easily.  All other systems reviewed and are negative.   No Known Allergies  Past Medical History:  Diagnosis Date   Arthritis    BPH (benign prostatic hyperplasia)    Carotid artery stenosis    a. s/p L CEA in 03/2017  Hypertension     Past Surgical History:  Procedure Laterality Date   CAROTID ENDARTERECTOMY Left 04/17/2017   COLONOSCOPY N/A 01/19/2017   Procedure: COLONOSCOPY;  Surgeon: Daneil Dolin, MD;  [facility];  Service: Endoscopy;  Laterality: N/A;  1:15pm   ENDARTERECTOMY Left 04/17/2017   Procedure: ENDARTERECTOMY CAROTID - LEFT;  Surgeon: Conrad [place], MD;  Location: [facility];  Service: Vascular;  Laterality: Left;   ESOPHAGOGASTRODUODENOSCOPY N/A 01/19/2017   Procedure: ESOPHAGOGASTRODUODENOSCOPY (EGD);  Surgeon: Daneil Dolin, MD;  [facility];  Service: Endoscopy;  Laterality: N/A;   PATCH ANGIOPLASTY Left 04/17/2017   Procedure: PATCH ANGIOPLASTY;  Surgeon: Conrad [place], MD;  Location: [facility];  Service: Vascular;  Laterality: Left;    Social History   Socioeconomic History   Marital status: Married    Spouse name: Not on file   Number of children: 1   Years of education: Not on file   Highest education level: Not on file  Occupational History   Occupation: retired  Tobacco Use   Smoking status: Former    Years: 30.00    Types: Cigarettes     Quit date: 1980    Years since quitting: 43.8   Smokeless tobacco: Never  Vaping Use   Vaping Use: Never used  Substance and Sexual Activity   Alcohol use: No   Drug use: No   Sexual activity: Never  Other Topics Concern   Not on file  Social History Narrative   Not on file   Social Determinants of Health   Financial Resource Strain: Not on file  Food Insecurity: Not on file  Transportation Needs: Not on file  Physical Activity: Not on file  Stress: Not on file  Social Connections: Not on file  Intimate Partner Violence: Not on file    Family History  Problem Relation Age of Onset   Hypertension Father    Colon cancer Neg Hx    Colon polyps Neg Hx     Anti-infectives: Anti-infectives (From admission, onward)    None       Current Outpatient Medications  Medication Sig Dispense Refill   acetaminophen (TYLENOL) 500 MG tablet Take 1,000 mg by mouth 2 (two) times daily as needed for moderate pain.     losartan (COZAAR) 25 MG tablet Take 25 mg by mouth daily.     losartan (COZAAR) 50 MG tablet Take 50 mg by mouth daily.     traMADol (ULTRAM) 50 MG tablet Take 50 mg by mouth every 6 (six) hours as needed.     VITAMIN D PO Take by mouth.     No current facility-administered medications for this visit.     Objective: Vital signs in last 24 hours: BP (!) 173/66   Pulse 84   Intake/Output from previous day: No intake/output data recorded. Intake/Output this shift: '@IOTHISSHIFT'$ @   Physical Exam Vitals reviewed.  Constitutional:      Appearance: Normal appearance.  Lymphadenopathy:     Cervical: No cervical adenopathy.     Upper Body:     Right upper body: No supraclavicular or axillary adenopathy.     Left upper body: No supraclavicular or axillary adenopathy.  Neurological:     General: No focal deficit present.     Mental Status: He is alert and oriented to person, place, and time.     Comments: He is unsteady on his feet.      Lab Results:   Results for orders placed  or performed in visit on 06/23/22 (from the past 24 hour(s))  Urinalysis, Routine w reflex microscopic     Status: None   Collection Time: 06/23/22  3:33 PM  Result Value Ref Range   Specific Gravity, UA 1.015 1.005 - 1.030   pH, UA 7.0 5.0 - 7.5   Color, UA Yellow Yellow   Appearance Ur Clear Clear   Leukocytes,UA Negative Negative   Protein,UA Negative Negative/Trace   Glucose, UA Negative Negative   Ketones, UA Negative Negative   RBC, UA Negative Negative   Bilirubin, UA Negative Negative   Urobilinogen, Ur 0.2 0.2 - 1.0 mg/dL   Nitrite, UA Negative Negative   Microscopic Examination Comment    Narrative   Performed at:  [facility] [address], [place], [place]  [id number] Lab Director: [place], Phone:  [phone]     BMET No results for input(s): "NA", "K", "CL", "CO2", "GLUCOSE", "BUN", "CREATININE", "CALCIUM" in the last 72 hours. PT/INR No results for input(s): "LABPROT", "INR" in the last 72 hours. ABG No results for input(s): "PHART", "HCO3" in the last 72 hours.  Invalid input(s): "PCO2", "PO2"  Studies/Results: PSMA PET report and films reviewed.    Assessment/Plan: Elevated PSA with right prostate induration and uptake in the prostate on the PSMA PET which is consistent with a localized prostate cancer.   His PSA is up further to 65.9  but he remains minimally symptomatic and has stable LUTS.    I order a repeat PET and if that is denies, I will get a bone scan.  He is not a great candidate for local curative therapy so the primarily indication to treat would be if he has metastatic disease contributing to his current fatigue and decline.     Hypogonadism.   His T is up to 228 from  206 in May.     Nodular prostate with LUTS.   He is voiding with stable to improved symptoms.  No orders of the defined types were placed in this encounter.    Orders Placed This Encounter  Procedures   NM PET (PSMA)  SKULL TO MID THIGH    Standing Status:   Future    Standing Expiration Date:   06/24/2023    Order Specific Question:   If indicated for the ordered procedure, I authorize the administration of a radiopharmaceutical per Radiology protocol    Answer:   Yes    Order Specific Question:   Preferred imaging location?    Answer:   [facility]   Urinalysis, Routine w reflex microscopic   PSA    Standing Status:   Future    Standing Expiration Date:   12/22/2022     Return in about 3 months (around 09/23/2022) for with PSA.    CC: Dr. Consuello Masse.      Irine Seal 06/24/2022 [phone]

## 2022-06-24 DIAGNOSIS — E7801 Familial hypercholesterolemia: Secondary | ICD-10-CM | POA: Diagnosis not present

## 2022-06-24 DIAGNOSIS — E7849 Other hyperlipidemia: Secondary | ICD-10-CM | POA: Diagnosis not present

## 2022-06-24 DIAGNOSIS — I10 Essential (primary) hypertension: Secondary | ICD-10-CM | POA: Diagnosis not present

## 2022-06-24 DIAGNOSIS — R5383 Other fatigue: Secondary | ICD-10-CM | POA: Diagnosis not present

## 2022-06-24 DIAGNOSIS — R739 Hyperglycemia, unspecified: Secondary | ICD-10-CM | POA: Diagnosis not present

## 2022-06-24 LAB — URINALYSIS, ROUTINE W REFLEX MICROSCOPIC
Bilirubin, UA: NEGATIVE
Glucose, UA: NEGATIVE
Ketones, UA: NEGATIVE
Leukocytes,UA: NEGATIVE
Nitrite, UA: NEGATIVE
Protein,UA: NEGATIVE
RBC, UA: NEGATIVE
Specific Gravity, UA: 1.015 (ref 1.005–1.030)
Urobilinogen, Ur: 0.2 mg/dL (ref 0.2–1.0)
pH, UA: 7 (ref 5.0–7.5)

## 2022-06-30 ENCOUNTER — Ambulatory Visit (HOSPITAL_COMMUNITY)
Admission: RE | Admit: 2022-06-30 | Discharge: 2022-06-30 | Disposition: A | Discharge status: Home / Self Care | Payer: PPO | Source: Ambulatory Visit | Attending: Urology | Admitting: Urology

## 2022-06-30 DIAGNOSIS — R972 Elevated prostate specific antigen [PSA]: Secondary | ICD-10-CM | POA: Insufficient documentation

## 2022-06-30 DIAGNOSIS — I10 Essential (primary) hypertension: Secondary | ICD-10-CM | POA: Diagnosis not present

## 2022-06-30 DIAGNOSIS — Z23 Encounter for immunization: Secondary | ICD-10-CM | POA: Diagnosis not present

## 2022-06-30 DIAGNOSIS — M545 Low back pain, unspecified: Secondary | ICD-10-CM | POA: Diagnosis not present

## 2022-06-30 DIAGNOSIS — Z6820 Body mass index (BMI) 20.0-20.9, adult: Secondary | ICD-10-CM | POA: Diagnosis not present

## 2022-06-30 DIAGNOSIS — C61 Malignant neoplasm of prostate: Secondary | ICD-10-CM | POA: Diagnosis not present

## 2022-06-30 DIAGNOSIS — R5383 Other fatigue: Secondary | ICD-10-CM | POA: Diagnosis not present

## 2022-06-30 DIAGNOSIS — D649 Anemia, unspecified: Secondary | ICD-10-CM | POA: Diagnosis not present

## 2022-06-30 DIAGNOSIS — I700 Atherosclerosis of aorta: Secondary | ICD-10-CM | POA: Diagnosis not present

## 2022-06-30 DIAGNOSIS — R0609 Other forms of dyspnea: Secondary | ICD-10-CM | POA: Diagnosis not present

## 2022-06-30 DIAGNOSIS — N401 Enlarged prostate with lower urinary tract symptoms: Secondary | ICD-10-CM | POA: Diagnosis not present

## 2022-06-30 DIAGNOSIS — E7849 Other hyperlipidemia: Secondary | ICD-10-CM | POA: Diagnosis not present

## 2022-06-30 MED ORDER — PIFLIFOLASTAT F 18 (PYLARIFY) INJECTION
9.0000 | Freq: Once | INTRAVENOUS | Status: AC
  Administered 2022-06-30: 9.76 via INTRAVENOUS

## 2022-06-30 MED ORDER — FLUDEOXYGLUCOSE F - 18 (FDG) INJECTION: 9.7600 | Freq: Once | INTRAVENOUS | Status: DC | PRN

## 2022-07-06 ENCOUNTER — Telehealth: Payer: Self-pay

## 2022-07-06 NOTE — Telephone Encounter (Signed)
-----   Message from Irine Seal, MD sent at 07/05/2022  8:08 PM EST ----- The PSMA PET still just shows uptake in the prostate consistent with localized prostate cancer.  He should return to see me as planned.  ----- Message ----- From: Sherrilyn Rist, CMA Sent: 07/04/2022   5:03 PM EST To: Irine Seal, MD  Please Review

## 2022-07-06 NOTE — Telephone Encounter (Signed)
Made patient's wife aware that the PSMA PET still just shows uptake in the prostate consistent with localized prostate cancer.  He should return to see Dr. Jeffie Pollock as planned. Wife voiced understanding.

## 2022-09-22 ENCOUNTER — Other Ambulatory Visit: Payer: PPO

## 2022-09-22 ENCOUNTER — Other Ambulatory Visit: Payer: Self-pay

## 2022-09-22 DIAGNOSIS — R972 Elevated prostate specific antigen [PSA]: Secondary | ICD-10-CM | POA: Diagnosis not present

## 2022-09-22 DIAGNOSIS — C61 Malignant neoplasm of prostate: Secondary | ICD-10-CM

## 2022-09-23 LAB — PSA: Prostate Specific Ag, Serum: 98.1 ng/mL — ABNORMAL HIGH (ref 0.0–4.0)

## 2022-09-29 ENCOUNTER — Ambulatory Visit (INDEPENDENT_AMBULATORY_CARE_PROVIDER_SITE_OTHER): Payer: PPO | Admitting: Urology

## 2022-09-29 ENCOUNTER — Encounter: Payer: Self-pay | Admitting: Urology

## 2022-09-29 VITALS — BP 151/71 | HR 91 | Ht 71.0 in | Wt 146.6 lb

## 2022-09-29 DIAGNOSIS — N403 Nodular prostate with lower urinary tract symptoms: Secondary | ICD-10-CM | POA: Diagnosis not present

## 2022-09-29 DIAGNOSIS — R972 Elevated prostate specific antigen [PSA]: Secondary | ICD-10-CM | POA: Diagnosis not present

## 2022-09-29 DIAGNOSIS — Z8546 Personal history of malignant neoplasm of prostate: Secondary | ICD-10-CM

## 2022-09-29 DIAGNOSIS — R3912 Poor urinary stream: Secondary | ICD-10-CM

## 2022-09-29 DIAGNOSIS — R351 Nocturia: Secondary | ICD-10-CM

## 2022-09-29 DIAGNOSIS — C61 Malignant neoplasm of prostate: Secondary | ICD-10-CM

## 2022-09-29 LAB — BLADDER SCAN: Scan Result: 66

## 2022-09-29 NOTE — Progress Notes (Signed)
post void residual=66

## 2022-09-29 NOTE — Progress Notes (Signed)
Subjective: 1. Elevated PSA   2. Nodular prostate with lower urinary tract symptoms   3. Weak urinary stream   4. Prostate cancer ([place])   5. Nocturia     09/29/22: Andrew [name] returns today in f/u for his history of prostate cancer found by PSMA PET.  His PSA is up to 98.1 from 65.9 in 10/23.  A repeat PET in 11/23 still only showed uptake in the prostate and no metastases.  His Alk phos was normal in 10/23 and [facility] R.  HIs UA is clear today and he has variable LUTS.  He has lost about 5 lb since his last visit. He has joint pain but no bone pain.  He has no GI complaints.   06/23/22: Andrew [name] returns today in f/u for his clinical diagnosis of prostate cancer with prostate uptake and no mets on a PSMA PET on 01/20/22.  His PSA has been rising and was 65.9 on 06/16/22 which was up from 61.4 in 9/23 and 39.8 in 5/23.  He remains weak and fell recently and bruised his left eye brow.  His weight is down slightly.  He has no bone pain but has some shoulder arthritis and low back pain.  He thinks he is voiding better with an IPSS of 11 and nocturia x 3. .    05/12/22: Andrew [name] returns today in f/u.  His PSA is up to 61.4 on 05/03/22 from 39.8 onm 01/06/22.  He had a PET scan on 01/20/22 that showed only prostate uptake and no mets.  He continues to void ok with an IPSS of 14.  He has nocturia about 4x night.  His weight is stable.  He has had no bone pain.  He remains fairly feeble.   5/18/23[name] returns today at the request of Dr. Quintin Alto for a further rise in his PSA to 68.  He has had progressive LUTS with a reduced stream with frequency  and urgency.  He has gotten progressively weak and is down 3-4 lbs.  He has no bone pain but has arthritis.   He has had no hematuria or dysuria.  He is on Vit D for a low level and he had a slight anemia.    04/10/20: Andrew [name] returns today in f/u for his history of nocturia.   He reports that the nocturia is down to 3-4x from 4-5x. He didn't stop the coke in the evenings and drinks a lot of  tea in the daytime.  His IPSS is 11.   He did a voiding diary and >50% of his UOP is at night.  His labs in July from Dr. Quintin Alto had a Cr of 0.9 and a sodium of 137.   His PSA was up to 9.9 from 6.7 over the past year.  He thinks it was about a 5 prior to that.   He has a reduced stream but is emptying well with a PVR of about 70m.    02/21/20: Andrew [name] an 89yo male who is sent by Dr. SQuintin Altofor BPH with BOO.   He has nocturia q2-3hrs.  He can have some hesitancy.  He doesn't feel he empties completely and has some intermittency.  He has no issues with urgency.  He has no daytime frequency.  He has a reduced stream.  He was given tamsulosin in the past for nocturia but it  made him dizzy so he stopped it after just a few days.   He has had no UTI's.  He has  had no stones or GU surgery.    He was unable to get a urine today.  ROS:  Review of Systems  Constitutional:  Positive for malaise/fatigue.  Respiratory:  Positive for shortness of breath.   Musculoskeletal:  Positive for back pain and joint pain.  Neurological:  Positive for weakness.  Endo/Heme/Allergies:  Bruises/bleeds easily.  All other systems reviewed and are negative.   No Known Allergies  Past Medical History:  Diagnosis Date   Arthritis    BPH (benign prostatic hyperplasia)    Carotid artery stenosis    a. s/p L CEA in 03/2017   Hypertension     Past Surgical History:  Procedure Laterality Date   CAROTID ENDARTERECTOMY Left 04/17/2017   COLONOSCOPY N/A 01/19/2017   Procedure: COLONOSCOPY;  Surgeon: Daneil Dolin, MD;  [facility];  Service: Endoscopy;  Laterality: N/A;  1:15pm   ENDARTERECTOMY Left 04/17/2017   Procedure: ENDARTERECTOMY CAROTID - LEFT;  Surgeon: Conrad [place], MD;  Location: [place];  Service: Vascular;  Laterality: Left;   ESOPHAGOGASTRODUODENOSCOPY N/A 01/19/2017   Procedure: ESOPHAGOGASTRODUODENOSCOPY (EGD);  Surgeon: Daneil Dolin, MD;  [facility];  Service: Endoscopy;   Laterality: N/A;   PATCH ANGIOPLASTY Left 04/17/2017   Procedure: PATCH ANGIOPLASTY;  Surgeon: Conrad [place], MD;  Location: [facility] OR;  Service: Vascular;  Laterality: Left;    Social History   Socioeconomic History   Marital status: Married    Spouse name: Not on file   Number of children: 1   Years of education: Not on file   Highest education level: Not on file  Occupational History   Occupation: retired  Tobacco Use   Smoking status: Former    Years: 30.00    Types: Cigarettes    Quit date: 1980    Years since quitting: 44.1   Smokeless tobacco: Never  Vaping Use   Vaping Use: Never used  Substance and Sexual Activity   Alcohol use: No   Drug use: No   Sexual activity: Never  Other Topics Concern   Not on file  Social History Narrative   Not on file   Social Determinants of Health   Financial Resource Strain: Not on file  Food Insecurity: Not on file  Transportation Needs: Not on file  Physical Activity: Not on file  Stress: Not on file  Social Connections: Not on file  Intimate Partner Violence: Not on file    Family History  Problem Relation Age of Onset   Hypertension Father    Colon cancer Neg Hx    Colon polyps Neg Hx     Anti-infectives: Anti-infectives (From admission, onward)    None       Current Outpatient Medications  Medication Sig Dispense Refill   acetaminophen (TYLENOL) 500 MG tablet Take 1,000 mg by mouth 2 (two) times daily as needed for moderate pain.     losartan (COZAAR) 25 MG tablet Take 25 mg by mouth daily.     losartan (COZAAR) 50 MG tablet Take 50 mg by mouth daily.     traMADol (ULTRAM) 50 MG tablet Take 50 mg by mouth every 6 (six) hours as needed.     VITAMIN D PO Take by mouth.     No current facility-administered medications for this visit.     Objective: Vital signs in last 24 hours: BP (!) 151/71   Pulse 91   Ht 5' 11"$  (1.803 m)   Wt 146 lb 9.6 oz (66.5  kg)   BMI 20.45 kg/m   Intake/Output from previous  day: No intake/output data recorded. Intake/Output this shift: @IOTHISSHIFT$ @   Physical Exam Vitals reviewed.  Constitutional:      Appearance: Normal appearance.  Lymphadenopathy:     Cervical: No cervical adenopathy.     Upper Body:     Right upper body: No supraclavicular or axillary adenopathy.     Left upper body: No supraclavicular or axillary adenopathy.  Neurological:     General: No focal deficit present.     Mental Status: He is alert and oriented to person, place, and time.     Comments: He is unsteady on his feet.      Lab Results:  Results for orders placed or performed in visit on 09/29/22 (from the past 24 hour(s))  Urinalysis, Routine w reflex microscopic     Status: Abnormal   Collection Time: 09/29/22  3:20 PM  Result Value Ref Range   Specific Gravity, UA 1.020 1.005 - 1.030   pH, UA 5.5 5.0 - 7.5   Color, UA Yellow Yellow   Appearance Ur Hazy (A) Clear   Leukocytes,UA Negative Negative   Protein,UA Negative Negative/Trace   Glucose, UA Negative Negative   Ketones, UA Trace (A) Negative   RBC, UA Negative Negative   Bilirubin, UA Negative Negative   Urobilinogen, Ur 0.2 0.2 - 1.0 mg/dL   Nitrite, UA Negative Negative   Microscopic Examination Comment    Narrative   Performed at:  [facility] [address], [place], [place]  [id number] Lab Director: Mina Marble MT, Phone:  [phone]      BMET No results for input(s): "NA", "K", "CL", "CO2", "GLUCOSE", "BUN", "CREATININE", "CALCIUM" in the last 72 hours. PT/INR No results for input(s): "LABPROT", "INR" in the last 72 hours. ABG No results for input(s): "PHART", "HCO3" in the last 72 hours.  Invalid input(s): "PCO2", "PO2"  Studies/Results: PSMA PET report and films reviewed.    Assessment/Plan: Elevated PSA with right prostate induration and uptake in the prostate on the PSMA PET which is consistent with a localized prostate cancer.   His PSA is up further to 98.1  but  he remains minimally symptomatic and has stable LUTS.  I will have him return in 3 months with a CT and bone scan as well as labs.    He is not a great candidate for local curative therapy so the primarily indication to treat would be if he has metastatic disease contributing to his current fatigue and decline.   I discussed the options for ADT which would be his primary treatment and the potential side effects.   Hypogonadism.   His T was 228 at last check.  I will repeat at f/u.     Nodular prostate with LUTS.   He is voiding with stable symptoms.  His PVR is 79m.  No orders of the defined types were placed in this encounter.    Orders Placed This Encounter  Procedures   NM Bone Scan Whole Body    Standing Status:   Future    Standing Expiration Date:   09/30/2023    Order Specific Question:   If indicated for the ordered procedure, I authorize the administration of a radiopharmaceutical per Radiology protocol    Answer:   Yes    Order Specific Question:   [facility]    [facility]   Order Specific Question:   Radiology Contrast  Protocol - do NOT remove file path    Answer:   [email or web address]   CT Abdomen Pelvis W Contrast    Standing Status:   Future    Standing Expiration Date:   03/30/2023    Order Specific Question:   If indicated for the ordered procedure, I authorize the administration of contrast media per Radiology protocol    Answer:   Yes    Order Specific Question:   Does the patient have a contrast media/X-ray dye allergy?    Answer:   No    Order Specific Question:   Preferred imaging location?    Answer:   [facility]    Order Specific Question:   Is Oral Contrast requested for this exam?    Answer:   No oral contrast    Order Specific Question:   Reason for No Oral Contrast    Answer:   Other    Order Specific Question:   Please answer why no oral contrast is requested    Answer:    not indicated   Urinalysis, Routine w reflex microscopic   PSA    Standing Status:   Future    Standing Expiration Date:   03/30/2023   Comprehensive metabolic panel    Standing Status:   Future    Standing Expiration Date:   03/30/2023   Testosterone    Standing Status:   Future    Standing Expiration Date:   03/30/2023   Bladder scan     Return in about 3 months (around 12/28/2022) for with labs, CT and bone scan. .    CC: Dr. Consuello Masse.      Andrew [name] 09/30/2022 [phone]

## 2022-09-30 LAB — URINALYSIS, ROUTINE W REFLEX MICROSCOPIC
Bilirubin, UA: NEGATIVE
Glucose, UA: NEGATIVE
Leukocytes,UA: NEGATIVE
Nitrite, UA: NEGATIVE
Protein,UA: NEGATIVE
RBC, UA: NEGATIVE
Specific Gravity, UA: 1.02 (ref 1.005–1.030)
Urobilinogen, Ur: 0.2 mg/dL (ref 0.2–1.0)
pH, UA: 5.5 (ref 5.0–7.5)

## 2022-12-26 ENCOUNTER — Ambulatory Visit (HOSPITAL_COMMUNITY): Payer: PPO

## 2022-12-26 DIAGNOSIS — D529 Folate deficiency anemia, unspecified: Secondary | ICD-10-CM | POA: Diagnosis not present

## 2022-12-26 DIAGNOSIS — E7849 Other hyperlipidemia: Secondary | ICD-10-CM | POA: Diagnosis not present

## 2022-12-26 DIAGNOSIS — R739 Hyperglycemia, unspecified: Secondary | ICD-10-CM | POA: Diagnosis not present

## 2022-12-26 DIAGNOSIS — D519 Vitamin B12 deficiency anemia, unspecified: Secondary | ICD-10-CM | POA: Diagnosis not present

## 2022-12-26 DIAGNOSIS — E782 Mixed hyperlipidemia: Secondary | ICD-10-CM | POA: Diagnosis not present

## 2022-12-26 DIAGNOSIS — D649 Anemia, unspecified: Secondary | ICD-10-CM | POA: Diagnosis not present

## 2022-12-28 ENCOUNTER — Other Ambulatory Visit: Payer: PPO

## 2022-12-28 DIAGNOSIS — C61 Malignant neoplasm of prostate: Secondary | ICD-10-CM | POA: Diagnosis not present

## 2022-12-29 LAB — COMPREHENSIVE METABOLIC PANEL
ALT: 9 IU/L (ref 0–44)
AST: 15 IU/L (ref 0–40)
Albumin/Globulin Ratio: 1.6 (ref 1.2–2.2)
Albumin: 4.2 g/dL (ref 3.7–4.7)
Alkaline Phosphatase: 86 IU/L (ref 44–121)
BUN/Creatinine Ratio: 25 — ABNORMAL HIGH (ref 10–24)
BUN: 20 mg/dL (ref 8–27)
Bilirubin Total: 0.4 mg/dL (ref 0.0–1.2)
CO2: 22 mmol/L (ref 20–29)
Calcium: 9.2 mg/dL (ref 8.6–10.2)
Chloride: 101 mmol/L (ref 96–106)
Creatinine, Ser: 0.79 mg/dL (ref 0.76–1.27)
Globulin, Total: 2.6 g/dL (ref 1.5–4.5)
Glucose: 104 mg/dL — ABNORMAL HIGH (ref 70–99)
Potassium: 4.4 mmol/L (ref 3.5–5.2)
Sodium: 138 mmol/L (ref 134–144)
Total Protein: 6.8 g/dL (ref 6.0–8.5)
eGFR: 85 mL/min/{1.73_m2} (ref >59)

## 2022-12-29 LAB — PSA: Prostate Specific Ag, Serum: 91.7 ng/mL — ABNORMAL HIGH (ref 0.0–4.0)

## 2022-12-29 LAB — TESTOSTERONE: Testosterone: 163 ng/dL — ABNORMAL LOW (ref 264–916)

## 2023-01-02 DIAGNOSIS — N401 Enlarged prostate with lower urinary tract symptoms: Secondary | ICD-10-CM | POA: Diagnosis not present

## 2023-01-02 DIAGNOSIS — E7849 Other hyperlipidemia: Secondary | ICD-10-CM | POA: Diagnosis not present

## 2023-01-02 DIAGNOSIS — D649 Anemia, unspecified: Secondary | ICD-10-CM | POA: Diagnosis not present

## 2023-01-02 DIAGNOSIS — C61 Malignant neoplasm of prostate: Secondary | ICD-10-CM | POA: Diagnosis not present

## 2023-01-02 DIAGNOSIS — M545 Low back pain, unspecified: Secondary | ICD-10-CM | POA: Diagnosis not present

## 2023-01-02 DIAGNOSIS — R972 Elevated prostate specific antigen [PSA]: Secondary | ICD-10-CM | POA: Diagnosis not present

## 2023-01-02 DIAGNOSIS — R739 Hyperglycemia, unspecified: Secondary | ICD-10-CM | POA: Diagnosis not present

## 2023-01-02 DIAGNOSIS — I700 Atherosclerosis of aorta: Secondary | ICD-10-CM | POA: Diagnosis not present

## 2023-01-02 DIAGNOSIS — Z23 Encounter for immunization: Secondary | ICD-10-CM | POA: Diagnosis not present

## 2023-01-02 DIAGNOSIS — R5383 Other fatigue: Secondary | ICD-10-CM | POA: Diagnosis not present

## 2023-01-02 DIAGNOSIS — I10 Essential (primary) hypertension: Secondary | ICD-10-CM | POA: Diagnosis not present

## 2023-01-02 DIAGNOSIS — R0609 Other forms of dyspnea: Secondary | ICD-10-CM | POA: Diagnosis not present

## 2023-01-05 ENCOUNTER — Ambulatory Visit: Payer: PPO | Admitting: Urology

## 2023-01-05 ENCOUNTER — Encounter: Payer: Self-pay | Admitting: Urology

## 2023-01-05 VITALS — BP 134/73 | HR 87

## 2023-01-05 DIAGNOSIS — R972 Elevated prostate specific antigen [PSA]: Secondary | ICD-10-CM

## 2023-01-05 DIAGNOSIS — N403 Nodular prostate with lower urinary tract symptoms: Secondary | ICD-10-CM

## 2023-01-05 DIAGNOSIS — E291 Testicular hypofunction: Secondary | ICD-10-CM | POA: Diagnosis not present

## 2023-01-05 DIAGNOSIS — C61 Malignant neoplasm of prostate: Secondary | ICD-10-CM

## 2023-01-05 DIAGNOSIS — R3912 Poor urinary stream: Secondary | ICD-10-CM | POA: Diagnosis not present

## 2023-01-05 DIAGNOSIS — R351 Nocturia: Secondary | ICD-10-CM | POA: Diagnosis not present

## 2023-01-05 NOTE — Progress Notes (Signed)
Subjective: 1. Prostate cancer ([facility])   2. Elevated PSA   3. Hypogonadism in male   4. Nodular prostate with lower urinary tract symptoms   5. Weak urinary stream   6. Nocturia      01/05/23: Andrew [name] returns today in f/u for his history below.  His PSA is down slightly to 91.7 from 98.1.   His IPSS is 15 with nocturia x 1 and he is content with his voiding symptoms. He has lost a little weight but has gained some back.  He has arthralgias but no other new pains.  He has no GI complaints.  His testosterone level is down some at 163.   His CMP is unremarkable.  HIs UA is clear.    09/29/22: Andrew [name] returns today in f/u for his history of prostate cancer found by PSMA PET.  His PSA is up to 98.1 from 65.9 in 10/23.  A repeat PET in 11/23 still only showed uptake in the prostate and no metastases.  His Alk phos was normal in 10/23 and [facility] R.  HIs UA is clear today and he has variable LUTS.  He has lost about 5 lb since his last visit. He has joint pain but no bone pain.  He has no GI complaints.   06/23/22: Andrew [name] returns today in f/u for his clinical diagnosis of prostate cancer with prostate uptake and no mets on a PSMA PET on 01/20/22.  His PSA has been rising and was 65.9 on 06/16/22 which was up from 61.4 in 9/23 and 39.8 in 5/23.  He remains weak and fell recently and bruised his left eye brow.  His weight is down slightly.  He has no bone pain but has some shoulder arthritis and low back pain.  He thinks he is voiding better with an IPSS of 11 and nocturia x 3. .    05/12/22: Andrew [name] returns today in f/u.  His PSA is up to 61.4 on 05/03/22 from 39.8 onm 01/06/22.  He had a PET scan on 01/20/22 that showed only prostate uptake and no mets.  He continues to void ok with an IPSS of 14.  He has nocturia about 4x night.  His weight is stable.  He has had no bone pain.  He remains fairly feeble.   5/18/23[name] returns today at the request of Dr. Neita Carp for a further rise in his PSA to 35.  He has had progressive LUTS  with a reduced stream with frequency  and urgency.  He has gotten progressively weak and is down 3-4 lbs.  He has no bone pain but has arthritis.   He has had no hematuria or dysuria.  He is on Vit D for a low level and he had a slight anemia.    04/10/20: Andrew [name] returns today in f/u for his history of nocturia.   He reports that the nocturia is down to 3-4x from 4-5x. He didn't stop the coke in the evenings and drinks a lot of tea in the daytime.  His IPSS is 11.   He did a voiding diary and >50% of his UOP is at night.  His labs in July from Dr. Neita Carp had a Cr of 0.9 and a sodium of 137.   His PSA was up to 9.9 from 6.7 over the past year.  He thinks it was about a 5 prior to that.   He has a reduced stream but is emptying well with a PVR of about 10ml.  02/21/20: Andrew [name] is an 89 yo male who is sent by Dr. Neita Carp for BPH with BOO.   He has nocturia q2-3hrs.  He can have some hesitancy.  He doesn't feel he empties completely and has some intermittency.  He has no issues with urgency.  He has no daytime frequency.  He has a reduced stream.  He was given tamsulosin in the past for nocturia but it  made him dizzy so he stopped it after just a few days.   He has had no UTI's.  He has had no stones or GU surgery.    He was unable to get a urine today.  ROS:  Review of Systems  Constitutional:  Positive for malaise/fatigue.  Respiratory:  Positive for shortness of breath.   Musculoskeletal:  Positive for back pain and joint pain.  Neurological:  Positive for weakness.  Endo/Heme/Allergies:  Bruises/bleeds easily.  All other systems reviewed and are negative.   No Known Allergies  Past Medical History:  Diagnosis Date   Arthritis    BPH (benign prostatic hyperplasia)    Carotid artery stenosis    a. s/p L CEA in 03/2017   Hypertension     Past Surgical History:  Procedure Laterality Date   CAROTID ENDARTERECTOMY Left 04/17/2017   COLONOSCOPY N/A 01/19/2017   Procedure: COLONOSCOPY;  Surgeon:  Corbin Ade, MD;  [facility];  Service: Endoscopy;  Laterality: N/A;  1:15pm   ENDARTERECTOMY Left 04/17/2017   Procedure: ENDARTERECTOMY CAROTID - LEFT;  Surgeon: Fransisco Hertz, MD;  Location: [facility] OR;  Service: Vascular;  Laterality: Left;   ESOPHAGOGASTRODUODENOSCOPY N/A 01/19/2017   Procedure: ESOPHAGOGASTRODUODENOSCOPY (EGD);  Surgeon: Corbin Ade, MD;  [facility];  Service: Endoscopy;  Laterality: N/A;   PATCH ANGIOPLASTY Left 04/17/2017   Procedure: PATCH ANGIOPLASTY;  Surgeon: Fransisco Hertz, MD;  Location: [facility] OR;  Service: Vascular;  Laterality: Left;    Social History   Socioeconomic History   Marital status: Married    Spouse name: Not on file   Number of children: 1   Years of education: Not on file   Highest education level: Not on file  Occupational History   Occupation: retired  Tobacco Use   Smoking status: Former    Years: 30    Types: Cigarettes    Quit date: 1980    Years since quitting: 44.4   Smokeless tobacco: Never  Vaping Use   Vaping Use: Never used  Substance and Sexual Activity   Alcohol use: No   Drug use: No   Sexual activity: Never  Other Topics Concern   Not on file  Social History Narrative   Not on file   Social Determinants of Health   Financial Resource Strain: Not on file  Food Insecurity: Not on file  Transportation Needs: Not on file  Physical Activity: Not on file  Stress: Not on file  Social Connections: Not on file  Intimate Partner Violence: Not on file    Family History  Problem Relation Age of Onset   Hypertension Father    Colon cancer Neg Hx    Colon polyps Neg Hx     Anti-infectives: Anti-infectives (From admission, onward)    None       Current Outpatient Medications  Medication Sig Dispense Refill   acetaminophen (TYLENOL) 500 MG tablet Take 1,000 mg by mouth 2 (two) times daily as needed for moderate pain.     ascorbic acid (VITAMIN C) 500 MG  tablet Take by mouth.      calcium citrate (CALCITRATE - DOSED IN MG ELEMENTAL CALCIUM) 950 (200 Ca) MG tablet Take by mouth.     Docusate Sodium (DSS) 100 MG CAPS Take by mouth.     HYDROcodone-acetaminophen (NORCO) 7.5-325 MG tablet Take 1 tablet by mouth every 6 (six) hours as needed.     losartan (COZAAR) 50 MG tablet Take 50 mg by mouth daily.     metoprolol succinate (TOPROL-XL) 25 MG 24 hr tablet Take by mouth.     Multiple Vitamins-Minerals (ICAPS LUTEIN & OMEGA-3) CAPS Take 1 capsule by mouth daily.     senna-docusate (SENOKOT-S) 8.6-50 MG tablet Take by mouth.     traMADol (ULTRAM) 50 MG tablet Take 50 mg by mouth every 6 (six) hours as needed.     VITAMIN D PO Take by mouth.     No current facility-administered medications for this visit.     Objective: Vital signs in last 24 hours: BP 134/73   Pulse 87   Intake/Output from previous day: No intake/output data recorded. Intake/Output this shift: @IOTHISSHIFT @   Physical Exam Vitals reviewed.  Constitutional:      Appearance: Normal appearance.  Lymphadenopathy:     Cervical: No cervical adenopathy.     Upper Body:     Right upper body: No supraclavicular or axillary adenopathy.     Left upper body: No supraclavicular or axillary adenopathy.  Neurological:     General: No focal deficit present.     Mental Status: He is alert and oriented to person, place, and time.     Comments: He is unsteady on his feet.      Lab Results:  No results found for this or any previous visit (from the past 24 hour(s)).  Lab Results  Component Value Date   PSA1 91.7 (H) 12/28/2022   PSA1 98.1 (H) 09/22/2022   PSA1 65.9 (H) 06/16/2022    Recent Results (from the past 2160 hour(s))  PSA     Status: Abnormal   Collection Time: 12/28/22  3:34 PM  Result Value Ref Range   Prostate Specific Ag, Serum 91.7 (H) 0.0 - 4.0 ng/mL    Comment: Roche ECLIA methodology. According to the American Urological Association, Serum PSA should decrease and remain at  undetectable levels after radical prostatectomy. The AUA defines biochemical recurrence as an initial PSA value 0.2 ng/mL or greater followed by a subsequent confirmatory PSA value 0.2 ng/mL or greater. Values obtained with different assay methods or kits cannot be used interchangeably. Results cannot be interpreted as absolute evidence of the presence or absence of malignant disease.   Comprehensive metabolic panel     Status: Abnormal   Collection Time: 12/28/22  3:34 PM  Result Value Ref Range   Glucose 104 (H) 70 - 99 mg/dL   BUN 20 8 - 27 mg/dL   Creatinine, Ser 1.61 0.76 - 1.27 mg/dL   eGFR 85 >09 UE/AVW/0.98   BUN/Creatinine Ratio 25 (H) 10 - 24   Sodium 138 134 - 144 mmol/L   Potassium 4.4 3.5 - 5.2 mmol/L   Chloride 101 96 - 106 mmol/L   CO2 22 20 - 29 mmol/L   Calcium 9.2 8.6 - 10.2 mg/dL   Total Protein 6.8 6.0 - 8.5 g/dL   Albumin 4.2 3.7 - 4.7 g/dL   Globulin, Total 2.6 1.5 - 4.5 g/dL   Albumin/Globulin Ratio 1.6 1.2 - 2.2   Bilirubin Total 0.4 0.0 - 1.2 mg/dL  Alkaline Phosphatase 86 44 - 121 IU/L   AST 15 0 - 40 IU/L   ALT 9 0 - 44 IU/L  Testosterone     Status: Abnormal   Collection Time: 12/28/22  3:34 PM  Result Value Ref Range   Testosterone 163 (L) 264 - 916 ng/dL    Comment: Adult male reference interval is based on a population of healthy nonobese males (BMI <30) between 51 and 73 years old. Travison, et.al. JCEM [phone]. PMID: [id number].        BMET No results for input(s): "NA", "K", "CL", "CO2", "GLUCOSE", "BUN", "CREATININE", "CALCIUM" in the last 72 hours. PT/INR No results for input(s): "LABPROT", "INR" in the last 72 hours. ABG No results for input(s): "PHART", "HCO3" in the last 72 hours.  Invalid input(s): "PCO2", "PO2"  Studies/Results: PSMA PET report and films reviewed.    Assessment/Plan: Elevated PSA with right prostate induration and uptake in the prostate on the PSMA PET which is consistent with a localized  prostate cancer.   His PSA is down to 9.17 from  98.1  but he remains minimally symptomatic and has stable LUTS.  I will have him return in 4 months    He is not a great candidate for local curative therapy so the primarily indication to treat would be if he has metastatic disease contributing to his current fatigue and decline.   I discussed the options for ADT which would be his primary treatment and the potential side effects.   Hypogonadism.   His T is sown to 163 from 228 at last check.  I will repeat at f/u.     Nodular prostate with LUTS.   He is voiding with stable symptoms. No orders of the defined types were placed in this encounter.    Orders Placed This Encounter  Procedures   PSA    Standing Status:   Future    Standing Expiration Date:   07/08/2023   Comprehensive metabolic panel    Standing Status:   Future    Standing Expiration Date:   07/08/2023   Testosterone    Standing Status:   Future    Standing Expiration Date:   07/08/2023     Return in about 4 months (around 05/08/2023).    CC: Dr. Fara Chute.      Bjorn Pippin 01/06/2023 [phone]

## 2023-04-27 ENCOUNTER — Other Ambulatory Visit: Payer: PPO

## 2023-05-02 DIAGNOSIS — R059 Cough, unspecified: Secondary | ICD-10-CM | POA: Diagnosis not present

## 2023-05-02 DIAGNOSIS — Z681 Body mass index (BMI) 19 or less, adult: Secondary | ICD-10-CM | POA: Diagnosis not present

## 2023-05-02 DIAGNOSIS — Z20828 Contact with and (suspected) exposure to other viral communicable diseases: Secondary | ICD-10-CM | POA: Diagnosis not present

## 2023-05-02 DIAGNOSIS — R030 Elevated blood-pressure reading, without diagnosis of hypertension: Secondary | ICD-10-CM | POA: Diagnosis not present

## 2023-05-04 ENCOUNTER — Ambulatory Visit: Payer: PPO | Admitting: Urology

## 2023-05-17 ENCOUNTER — Other Ambulatory Visit: Payer: PPO

## 2023-05-17 DIAGNOSIS — E291 Testicular hypofunction: Secondary | ICD-10-CM

## 2023-05-17 DIAGNOSIS — C61 Malignant neoplasm of prostate: Secondary | ICD-10-CM

## 2023-05-18 LAB — COMPREHENSIVE METABOLIC PANEL
ALT: 9 IU/L (ref 0–44)
AST: 14 IU/L (ref 0–40)
Albumin: 3.9 g/dL (ref 3.6–4.6)
Alkaline Phosphatase: 82 IU/L (ref 44–121)
BUN/Creatinine Ratio: 27 — ABNORMAL HIGH (ref 10–24)
BUN: 20 mg/dL (ref 10–36)
Bilirubin Total: 0.4 mg/dL (ref 0.0–1.2)
CO2: 25 mmol/L (ref 20–29)
Calcium: 9.4 mg/dL (ref 8.6–10.2)
Chloride: 100 mmol/L (ref 96–106)
Creatinine, Ser: 0.73 mg/dL — ABNORMAL LOW (ref 0.76–1.27)
Globulin, Total: 2.7 g/dL (ref 1.5–4.5)
Glucose: 101 mg/dL — ABNORMAL HIGH (ref 70–99)
Potassium: 4.4 mmol/L (ref 3.5–5.2)
Sodium: 137 mmol/L (ref 134–144)
Total Protein: 6.6 g/dL (ref 6.0–8.5)
eGFR: 86 mL/min/{1.73_m2} (ref >59)

## 2023-05-18 LAB — TESTOSTERONE: Testosterone: 201 ng/dL — ABNORMAL LOW (ref 264–916)

## 2023-05-18 LAB — PSA: Prostate Specific Ag, Serum: 145 ng/mL — ABNORMAL HIGH (ref 0.0–4.0)

## 2023-05-25 ENCOUNTER — Ambulatory Visit: Payer: PPO | Admitting: Urology

## 2023-05-25 ENCOUNTER — Encounter: Payer: Self-pay | Admitting: Urology

## 2023-05-25 VITALS — BP 158/66 | HR 71 | Ht 71.0 in | Wt 146.6 lb

## 2023-05-25 DIAGNOSIS — R9721 Rising PSA following treatment for malignant neoplasm of prostate: Secondary | ICD-10-CM | POA: Diagnosis not present

## 2023-05-25 DIAGNOSIS — Z8546 Personal history of malignant neoplasm of prostate: Secondary | ICD-10-CM

## 2023-05-25 DIAGNOSIS — E291 Testicular hypofunction: Secondary | ICD-10-CM | POA: Diagnosis not present

## 2023-05-25 DIAGNOSIS — C61 Malignant neoplasm of prostate: Secondary | ICD-10-CM

## 2023-05-25 DIAGNOSIS — N3941 Urge incontinence: Secondary | ICD-10-CM

## 2023-05-25 DIAGNOSIS — R972 Elevated prostate specific antigen [PSA]: Secondary | ICD-10-CM

## 2023-05-25 DIAGNOSIS — R350 Frequency of micturition: Secondary | ICD-10-CM

## 2023-05-25 DIAGNOSIS — N403 Nodular prostate with lower urinary tract symptoms: Secondary | ICD-10-CM | POA: Diagnosis not present

## 2023-05-25 LAB — MICROSCOPIC EXAMINATION
Bacteria, UA: NONE SEEN
WBC, UA: NONE SEEN /[HPF] (ref 0–5)

## 2023-05-25 LAB — URINALYSIS, ROUTINE W REFLEX MICROSCOPIC
Bilirubin, UA: NEGATIVE
Glucose, UA: NEGATIVE
Ketones, UA: NEGATIVE
Leukocytes,UA: NEGATIVE
Nitrite, UA: NEGATIVE
Protein,UA: NEGATIVE
Specific Gravity, UA: 1.02 (ref 1.005–1.030)
Urobilinogen, Ur: 0.2 mg/dL (ref 0.2–1.0)
pH, UA: 6 (ref 5.0–7.5)

## 2023-05-25 LAB — BLADDER SCAN AMB NON-IMAGING: Scan Result: 103

## 2023-05-25 MED ORDER — DEGARELIX ACETATE(240 MG DOSE) 120 MG/VIAL ~~LOC~~ SOLR
240.0000 mg | Freq: Once | SUBCUTANEOUS | Status: DC
Start: 2023-05-30 — End: 2024-05-08

## 2023-05-25 NOTE — Progress Notes (Signed)
Subjective: 1. Frequent urination   2. Nodular prostate with lower urinary tract symptoms   3. Prostate cancer (HCC)   4. Elevated PSA   5. Hypogonadism in male   65. Urge incontinence    05/25/23: Andrew [name] returns today in f/u.  His PSA is up to 145 from 92.  Over the past month he has had increased frequency and nocturia.  He has some urgency with UUI.  He has hesitancy with intermittency and a reduced stream.  He may have lost a couple of pounds.  He has back pain that is chronic.  His testosterone level is 201. His PVR is .  HIs IPSS is up to 26.  His CMP is ok.      01/05/23: Andrew [name] returns today in f/u for his history below.  His PSA is down slightly to 91.7 from 98.1.   His IPSS is 15 with nocturia x 1 and he is content with his voiding symptoms. He has lost a little weight but has gained some back.  He has arthralgias but no other new pains.  He has no GI complaints.  His testosterone level is down some at 163.   His CMP is unremarkable.  HIs UA is clear.    09/29/22: Andrew [name] returns today in f/u for his history of prostate cancer found by PSMA PET.  His PSA is up to 98.1 from 65.9 in 10/23.  A repeat PET in 11/23 still only showed uptake in the prostate and no metastases.  His Alk phos was normal in 10/23 and [facility] R.  HIs UA is clear today and he has variable LUTS.  He has lost about 5 lb since his last visit. He has joint pain but no bone pain.  He has no GI complaints.   06/23/22: Andrew [name] returns today in f/u for his clinical diagnosis of prostate cancer with prostate uptake and no mets on a PSMA PET on 01/20/22.  His PSA has been rising and was 65.9 on 06/16/22 which was up from 61.4 in 9/23 and 39.8 in 5/23.  He remains weak and fell recently and bruised his left eye brow.  His weight is down slightly.  He has no bone pain but has some shoulder arthritis and low back pain.  He thinks he is voiding better with an IPSS of 11 and nocturia x 3. .    05/12/22: Andrew [name] returns today in f/u.  His PSA is up  to 61.4 on 05/03/22 from 39.8 onm 01/06/22.  He had a PET scan on 01/20/22 that showed only prostate uptake and no mets.  He continues to void ok with an IPSS of 14.  He has nocturia about 4x night.  His weight is stable.  He has had no bone pain.  He remains fairly feeble.   5/18/23[name] returns today at the request of Dr. Neita Carp for a further rise in his PSA to 35.  He has had progressive LUTS with a reduced stream with frequency  and urgency.  He has gotten progressively weak and is down 3-4 lbs.  He has no bone pain but has arthritis.   He has had no hematuria or dysuria.  He is on Vit D for a low level and he had a slight anemia.    04/10/20: Andrew [name] returns today in f/u for his history of nocturia.   He reports that the nocturia is down to 3-4x from 4-5x. He didn't stop the coke in the evenings and drinks a lot of tea  in the daytime.  His IPSS is 11.   He did a voiding diary and >50% of his UOP is at night.  His labs in July from Dr. Neita Carp had a Cr of 0.9 and a sodium of 137.   His PSA was up to 9.9 from 6.7 over the past year.  He thinks it was about a 5 prior to that.   He has a reduced stream but is emptying well with a PVR of about 10ml.    02/21/20: Andrew [name] is an 89 yo male who is sent by Dr. Neita Carp for BPH with BOO.   He has nocturia q2-3hrs.  He can have some hesitancy.  He doesn't feel he empties completely and has some intermittency.  He has no issues with urgency.  He has no daytime frequency.  He has a reduced stream.  He was given tamsulosin in the past for nocturia but it  made him dizzy so he stopped it after just a few days.   He has had no UTI's.  He has had no stones or GU surgery.    He was unable to get a urine today.  ROS:  Review of Systems  Constitutional:  Positive for malaise/fatigue.  Respiratory:  Positive for shortness of breath.   Musculoskeletal:  Positive for back pain and joint pain.  Neurological:  Positive for weakness.  Endo/Heme/Allergies:  Bruises/bleeds easily.  All  other systems reviewed and are negative.   No Known Allergies  Past Medical History:  Diagnosis Date   Arthritis    BPH (benign prostatic hyperplasia)    Carotid artery stenosis    a. s/p L CEA in 03/2017   Hypertension     Past Surgical History:  Procedure Laterality Date   CAROTID ENDARTERECTOMY Left 04/17/2017   COLONOSCOPY N/A 01/19/2017   Procedure: COLONOSCOPY;  Surgeon: Corbin Ade, MD;  Location: [facility];  Service: Endoscopy;  Laterality: N/A;  1:15pm   ENDARTERECTOMY Left 04/17/2017   Procedure: ENDARTERECTOMY CAROTID - LEFT;  Surgeon: Fransisco Hertz, MD;  Location: [facility] OR;  Service: Vascular;  Laterality: Left;   ESOPHAGOGASTRODUODENOSCOPY N/A 01/19/2017   Procedure: ESOPHAGOGASTRODUODENOSCOPY (EGD);  Surgeon: Corbin Ade, MD;  Location: [facility];  Service: Endoscopy;  Laterality: N/A;   PATCH ANGIOPLASTY Left 04/17/2017   Procedure: PATCH ANGIOPLASTY;  Surgeon: Fransisco Hertz, MD;  Location: [facility] OR;  Service: Vascular;  Laterality: Left;    Social History   Socioeconomic History   Marital status: Married    Spouse name: Not on file   Number of children: 1   Years of education: Not on file   Highest education level: Not on file  Occupational History   Occupation: retired  Tobacco Use   Smoking status: Former    Current packs/day: 0.00    Types: Cigarettes    Start date: 1950    Quit date: 1980    Years since quitting: 44.7   Smokeless tobacco: Never  Vaping Use   Vaping status: Never Used  Substance and Sexual Activity   Alcohol use: No   Drug use: No   Sexual activity: Never  Other Topics Concern   Not on file  Social History Narrative   Not on file   Social Determinants of Health   Financial Resource Strain: Not on file  Food Insecurity: Not on file  Transportation Needs: Not on file  Physical Activity: Not on file  Stress: Not on file  Social Connections: Not on file  Intimate Partner Violence: Not on file    Family History   Problem Relation Age of Onset   Hypertension Father    Colon cancer Neg Hx    Colon polyps Neg Hx     Anti-infectives: Anti-infectives (From admission, onward)    None       Current Outpatient Medications  Medication Sig Dispense Refill   acetaminophen (TYLENOL) 500 MG tablet Take 1,000 mg by mouth 2 (two) times daily as needed for moderate pain.     ascorbic acid (VITAMIN C) 500 MG tablet Take by mouth.     calcium citrate (CALCITRATE - DOSED IN MG ELEMENTAL CALCIUM) 950 (200 Ca) MG tablet Take by mouth.     Docusate Sodium (DSS) 100 MG CAPS Take by mouth.     HYDROcodone-acetaminophen (NORCO) 7.5-325 MG tablet Take 1 tablet by mouth every 6 (six) hours as needed.     losartan (COZAAR) 50 MG tablet Take 50 mg by mouth daily.     Multiple Vitamins-Minerals (ICAPS LUTEIN & OMEGA-3) CAPS Take 1 capsule by mouth daily.     senna-docusate (SENOKOT-S) 8.6-50 MG tablet Take by mouth.     traMADol (ULTRAM) 50 MG tablet Take 50 mg by mouth every 6 (six) hours as needed.     VITAMIN D PO Take by mouth.     metoprolol succinate (TOPROL-XL) 25 MG 24 hr tablet Take by mouth. (Patient not taking: Reported on 05/25/2023)     Current Facility-Administered Medications  Medication Dose Route Frequency Provider Last Rate Last Admin   [START ON 05/30/2023] degarelix (FIRMAGON) injection 240 mg  240 mg Subcutaneous Once          Objective: Vital signs in last 24 hours: BP (!) 158/66   Pulse 71   Ht 5\' 11"  (1.803 m)   Wt 146 lb 9.6 oz (66.5 kg)   BMI 20.45 kg/m   Intake/Output from previous day: No intake/output data recorded. Intake/Output this shift: @IOTHISSHIFT @   Physical Exam Vitals reviewed.  Constitutional:      Appearance: Normal appearance.  Lymphadenopathy:     Cervical: No cervical adenopathy.     Upper Body:     Right upper body: No supraclavicular or axillary adenopathy.     Left upper body: No supraclavicular or axillary adenopathy.  Neurological:     General: No  focal deficit present.     Mental Status: He is alert and oriented to person, place, and time.     Comments: He is unsteady on his feet.      Lab Results:  No results found for this or any previous visit (from the past 24 hour(s)).  Lab Results  Component Value Date   PSA1 145.0 (H) 05/17/2023   PSA1 91.7 (H) 12/28/2022   PSA1 98.1 (H) 09/22/2022    Recent Results (from the past 2160 hour(s))  PSA     Status: Abnormal   Collection Time: 05/17/23  1:40 PM  Result Value Ref Range   Prostate Specific Ag, Serum 145.0 (H) 0.0 - 4.0 ng/mL    Comment: Results confirmed on dilution. Roche ECLIA methodology. According to the American Urological Association, Serum PSA should decrease and remain at undetectable levels after radical prostatectomy. The AUA defines biochemical recurrence as an initial PSA value 0.2 ng/mL or greater followed by a subsequent confirmatory PSA value 0.2 ng/mL or greater. Values obtained with different assay methods or kits cannot be used interchangeably. Results cannot be interpreted as absolute evidence of the presence or absence  of malignant disease.   Comprehensive metabolic panel     Status: Abnormal   Collection Time: 05/17/23  1:40 PM  Result Value Ref Range   Glucose 101 (H) 70 - 99 mg/dL   BUN 20 10 - 36 mg/dL   Creatinine, Ser 9.56 (L) 0.76 - 1.27 mg/dL   eGFR 86 >21 HY/QMV/7.84   BUN/Creatinine Ratio 27 (H) 10 - 24   Sodium 137 134 - 144 mmol/L   Potassium 4.4 3.5 - 5.2 mmol/L   Chloride 100 96 - 106 mmol/L   CO2 25 20 - 29 mmol/L   Calcium 9.4 8.6 - 10.2 mg/dL   Total Protein 6.6 6.0 - 8.5 g/dL   Albumin 3.9 3.6 - 4.6 g/dL   Globulin, Total 2.7 1.5 - 4.5 g/dL   Bilirubin Total 0.4 0.0 - 1.2 mg/dL   Alkaline Phosphatase 82 44 - 121 IU/L   AST 14 0 - 40 IU/L   ALT 9 0 - 44 IU/L  Testosterone     Status: Abnormal   Collection Time: 05/17/23  1:40 PM  Result Value Ref Range   Testosterone 201 (L) 264 - 916 ng/dL    Comment: Adult male  reference interval is based on a population of healthy nonobese males (BMI <30) between 64 and 92 years old. Travison, et.al. JCEM [phone]. PMID: [id number].   Urinalysis, Routine w reflex microscopic     Status: Abnormal   Collection Time: 05/25/23  9:01 AM  Result Value Ref Range   Specific Gravity, UA 1.020 1.005 - 1.030   pH, UA 6.0 5.0 - 7.5   Color, UA Yellow Yellow   Appearance Ur Clear Clear   Leukocytes,UA Negative Negative   Protein,UA Negative Negative/Trace   Glucose, UA Negative Negative   Ketones, UA Negative Negative   RBC, UA Trace (A) Negative   Bilirubin, UA Negative Negative   Urobilinogen, Ur 0.2 0.2 - 1.0 mg/dL   Nitrite, UA Negative Negative   Microscopic Examination See below:     Comment: Microscopic was indicated and was performed.  Microscopic Examination     Status: None   Collection Time: 05/25/23  9:01 AM   Urine  Result Value Ref Range   WBC, UA None seen 0 - 5 /hpf   RBC, Urine 0-2 0 - 2 /hpf   Epithelial Cells (non renal) 0-10 0 - 10 /hpf   Bacteria, UA None seen None seen/Few  BLADDER SCAN AMB NON-IMAGING     Status: None   Collection Time: 05/25/23  9:17 AM  Result Value Ref Range   Scan Result 103        BMET No results for input(s): "NA", "K", "CL", "CO2", "GLUCOSE", "BUN", "CREATININE", "CALCIUM" in the last 72 hours. PT/INR No results for input(s): "LABPROT", "INR" in the last 72 hours. ABG No results for input(s): "PHART", "HCO3" in the last 72 hours.  Invalid input(s): "PCO2", "PO2"  Studies/Results: PSMA PET report and films reviewed.    Assessment/Plan: Elevated PSA with right prostate induration and uptake in the prostate on the PSMA PET which is consistent with a localized prostate cancer.   His PSA is up to 145 and his LUTS have worsened significantly.     I am going to get him started on ADT and after reviewing the options and side effects, I will have him return to initiate firmagon and then continue monthly  injection.   He will return a month after his first dose with labs and then see me  3 months later with labs.   I will get a bone scan as well since he is reporting some back pain.   Hypogonadism.   His T is 201 which is fairly stable.   Nodular prostate with LUTS.   He is voiding with progressive symptoms.  I will reassess on ADT.   Meds ordered this encounter  Medications   degarelix (FIRMAGON) injection 240 mg     Orders Placed This Encounter  Procedures   Microscopic Examination   NM Bone Scan Whole Body    Standing Status:   Future    Standing Expiration Date:   05/24/2024    Order Specific Question:   If indicated for the ordered procedure, I authorize the administration of a radiopharmaceutical per Radiology protocol    Answer:   Yes    Order Specific Question:   Preferred imaging location?    Answer:   [facility]   Urinalysis, Routine w reflex microscopic   PSA    Standing Status:   Future    Standing Expiration Date:   11/23/2023   Testosterone    Standing Status:   Future    Standing Expiration Date:   11/23/2023   PSA    Standing Status:   Future    Standing Expiration Date:   11/23/2023   Testosterone    Standing Status:   Future    Standing Expiration Date:   11/23/2023   Comprehensive metabolic panel    Standing Status:   Future    Standing Expiration Date:   11/23/2023   BLADDER SCAN AMB NON-IMAGING     Return in about 5 weeks (around 06/29/2023) for Needs firmagon 240mg  asap and then return in month with labs to see me or sarah..    CC: Dr. Fara Chute.      Bjorn Pippin 05/26/2023 [phone] ID: Lianne Cure, male   DOB: September 15, 1930, 89 y.o.   MRN: [id number]

## 2023-05-25 NOTE — Progress Notes (Signed)
post void residual=103

## 2023-05-31 ENCOUNTER — Ambulatory Visit (INDEPENDENT_AMBULATORY_CARE_PROVIDER_SITE_OTHER): Payer: PPO

## 2023-05-31 DIAGNOSIS — C61 Malignant neoplasm of prostate: Secondary | ICD-10-CM

## 2023-05-31 MED ORDER — DEGARELIX ACETATE(240 MG DOSE) 120 MG/VIAL ~~LOC~~ SOLR
240.0000 mg | Freq: Once | SUBCUTANEOUS | Status: AC
Start: 2023-05-31 — End: 2023-05-31
  Administered 2023-05-31: 240 mg via SUBCUTANEOUS

## 2023-05-31 NOTE — Progress Notes (Signed)
Firmagon Sub Q Injection  Due to Prostate Cancer patient is present today for a Firmagon Injection.   Medication: Deborra Medina (Degarelix)  Dose: 240mg  Location: right/left upper abdomen Lot: [id number] Exp: 03/22/2025  Patient tolerated well, no complications were noted  Performed by: Kennyth Lose, CMA  Follow up: Keep follow up visited

## 2023-06-01 ENCOUNTER — Ambulatory Visit (HOSPITAL_COMMUNITY)
Admission: RE | Admit: 2023-06-01 | Discharge: 2023-06-01 | Disposition: A | Discharge status: Home / Self Care | Payer: PPO | Source: Ambulatory Visit | Attending: Urology | Admitting: Urology

## 2023-06-01 DIAGNOSIS — R972 Elevated prostate specific antigen [PSA]: Secondary | ICD-10-CM | POA: Diagnosis not present

## 2023-06-01 DIAGNOSIS — C61 Malignant neoplasm of prostate: Secondary | ICD-10-CM | POA: Diagnosis not present

## 2023-06-01 MED ORDER — TECHNETIUM TC 99M MEDRONATE IV KIT
20.0000 | PACK | Freq: Once | INTRAVENOUS | Status: AC | PRN
  Administered 2023-06-01: 20 via INTRAVENOUS

## 2023-06-06 ENCOUNTER — Other Ambulatory Visit: Payer: PPO

## 2023-06-15 ENCOUNTER — Ambulatory Visit: Payer: PPO | Admitting: Urology

## 2023-06-23 ENCOUNTER — Other Ambulatory Visit: Payer: PPO

## 2023-06-23 DIAGNOSIS — C61 Malignant neoplasm of prostate: Secondary | ICD-10-CM

## 2023-06-24 LAB — TESTOSTERONE: Testosterone: 42 ng/dL — ABNORMAL LOW (ref 264–916)

## 2023-06-24 LAB — PSA: Prostate Specific Ag, Serum: 111 ng/mL — ABNORMAL HIGH (ref 0.0–4.0)

## 2023-06-26 ENCOUNTER — Telehealth: Payer: Self-pay

## 2023-06-26 NOTE — Telephone Encounter (Signed)
Informed granddaughter of MD's response to results, she voiced that she will notify patient.

## 2023-06-26 NOTE — Telephone Encounter (Signed)
-----   Message from Bjorn Pippin sent at 06/26/2023  1:29 PM EST ----- PSA is falling with a decline in the testosterone. He should continue the monthly firmagon and will need f/u with a PSA and testosterone in 3 months. ----- Message ----- From: Grier Rocher, CMA Sent: 06/26/2023  12:05 PM EST To: Bjorn Pippin, MD  Please review.

## 2023-06-26 NOTE — Telephone Encounter (Signed)
-----   Message from Bjorn Pippin sent at 06/26/2023  1:33 PM EST ----- He is not felt to have metastatic disease in the bones, just arthritis. ----- Message ----- From: Grier Rocher, CMA Sent: 06/26/2023  12:05 PM EST To: Bjorn Pippin, MD  Please review.

## 2023-06-26 NOTE — Telephone Encounter (Signed)
I spoke with granddaughter she is aware of MD response and confirmed patient will follow up as scheduled

## 2023-06-28 ENCOUNTER — Ambulatory Visit: Payer: PPO | Admitting: Urology

## 2023-06-28 VITALS — BP 151/68 | HR 101 | Temp 98.4°F

## 2023-06-28 DIAGNOSIS — C61 Malignant neoplasm of prostate: Secondary | ICD-10-CM | POA: Diagnosis not present

## 2023-06-28 DIAGNOSIS — N403 Nodular prostate with lower urinary tract symptoms: Secondary | ICD-10-CM

## 2023-06-28 DIAGNOSIS — R351 Nocturia: Secondary | ICD-10-CM

## 2023-06-28 DIAGNOSIS — R3912 Poor urinary stream: Secondary | ICD-10-CM

## 2023-06-28 DIAGNOSIS — E291 Testicular hypofunction: Secondary | ICD-10-CM

## 2023-06-28 DIAGNOSIS — R350 Frequency of micturition: Secondary | ICD-10-CM

## 2023-06-28 DIAGNOSIS — N3941 Urge incontinence: Secondary | ICD-10-CM

## 2023-06-28 MED ORDER — DEGARELIX ACETATE 80 MG ~~LOC~~ SOLR
80.0000 mg | Freq: Once | SUBCUTANEOUS | Status: AC
Start: 2023-06-28 — End: 2023-06-28
  Administered 2023-06-28: 80 mg via SUBCUTANEOUS

## 2023-06-28 NOTE — Progress Notes (Signed)
post void residual=2 

## 2023-06-28 NOTE — Progress Notes (Signed)
Firmagon Sub Q Injection  Due to Prostate Cancer patient is present today for a Firmagon Injection.   Medication: Deborra Medina (Degarelix)  Dose: 80mg  Location: right upper abdomen Lot: [id number] Exp: 03/2025  Patient tolerated well, no complications were noted  Performed by: Guss Bunde, CMA  Follow up: as sceduled

## 2023-06-29 ENCOUNTER — Ambulatory Visit: Payer: PPO

## 2023-07-14 DIAGNOSIS — E7801 Familial hypercholesterolemia: Secondary | ICD-10-CM | POA: Diagnosis not present

## 2023-07-14 DIAGNOSIS — D649 Anemia, unspecified: Secondary | ICD-10-CM | POA: Diagnosis not present

## 2023-07-14 DIAGNOSIS — R7301 Impaired fasting glucose: Secondary | ICD-10-CM | POA: Diagnosis not present

## 2023-07-14 DIAGNOSIS — E7849 Other hyperlipidemia: Secondary | ICD-10-CM | POA: Diagnosis not present

## 2023-07-14 DIAGNOSIS — C61 Malignant neoplasm of prostate: Secondary | ICD-10-CM | POA: Diagnosis not present

## 2023-07-18 DIAGNOSIS — M545 Low back pain, unspecified: Secondary | ICD-10-CM | POA: Diagnosis not present

## 2023-07-18 DIAGNOSIS — C61 Malignant neoplasm of prostate: Secondary | ICD-10-CM | POA: Diagnosis not present

## 2023-07-18 DIAGNOSIS — R972 Elevated prostate specific antigen [PSA]: Secondary | ICD-10-CM | POA: Diagnosis not present

## 2023-07-18 DIAGNOSIS — Z23 Encounter for immunization: Secondary | ICD-10-CM | POA: Diagnosis not present

## 2023-07-18 DIAGNOSIS — R0609 Other forms of dyspnea: Secondary | ICD-10-CM | POA: Diagnosis not present

## 2023-07-18 DIAGNOSIS — N401 Enlarged prostate with lower urinary tract symptoms: Secondary | ICD-10-CM | POA: Diagnosis not present

## 2023-07-18 DIAGNOSIS — I10 Essential (primary) hypertension: Secondary | ICD-10-CM | POA: Diagnosis not present

## 2023-07-18 DIAGNOSIS — Z6820 Body mass index (BMI) 20.0-20.9, adult: Secondary | ICD-10-CM | POA: Diagnosis not present

## 2023-07-18 DIAGNOSIS — I700 Atherosclerosis of aorta: Secondary | ICD-10-CM | POA: Diagnosis not present

## 2023-07-18 DIAGNOSIS — M1712 Unilateral primary osteoarthritis, left knee: Secondary | ICD-10-CM | POA: Diagnosis not present

## 2023-07-18 DIAGNOSIS — E7849 Other hyperlipidemia: Secondary | ICD-10-CM | POA: Diagnosis not present

## 2023-07-18 DIAGNOSIS — D649 Anemia, unspecified: Secondary | ICD-10-CM | POA: Diagnosis not present

## 2023-07-27 ENCOUNTER — Ambulatory Visit: Payer: PPO

## 2023-07-27 DIAGNOSIS — C61 Malignant neoplasm of prostate: Secondary | ICD-10-CM | POA: Diagnosis not present

## 2023-07-27 MED ORDER — DEGARELIX ACETATE 80 MG ~~LOC~~ SOLR
80.0000 mg | Freq: Once | SUBCUTANEOUS | Status: AC
Start: 2023-07-27 — End: 2023-07-27
  Administered 2023-07-27: 80 mg via SUBCUTANEOUS

## 2023-07-27 NOTE — Progress Notes (Signed)
Firmagon Sub Q Injection  Due to Prostate Cancer patient is present today for a Firmagon Injection.   Medication: Firmagon (Degarelix)  Dose: 80mg  Location: left upper abdomen  Patient tolerated well, no complications were noted  Performed by: Alexavier Tsutsui LPN   Follow WG:NFAO scheduled NV

## 2023-08-08 DIAGNOSIS — M1712 Unilateral primary osteoarthritis, left knee: Secondary | ICD-10-CM | POA: Diagnosis not present

## 2023-08-08 DIAGNOSIS — I10 Essential (primary) hypertension: Secondary | ICD-10-CM | POA: Diagnosis not present

## 2023-08-25 ENCOUNTER — Ambulatory Visit: Payer: PPO

## 2023-08-25 DIAGNOSIS — C61 Malignant neoplasm of prostate: Secondary | ICD-10-CM

## 2023-08-25 MED ORDER — DEGARELIX ACETATE 80 MG ~~LOC~~ SOLR
80.0000 mg | Freq: Once | SUBCUTANEOUS | Status: AC
Start: 2023-08-25 — End: 2023-08-25
  Administered 2023-08-25: 80 mg via SUBCUTANEOUS

## 2023-08-25 NOTE — Progress Notes (Addendum)
  Firmagon  Sub Q Injection  Due to Prostate Cancer patient is present today for a Firmagon  Injection.   Medication: Firmagon  (Degarelix )  Dose: 80mg  Location: right upper abdomen Lot: [id number] Exp: 06/22/2025  Patient tolerated well, no complications were noted  Performed by: Exie CMA  Follow up: As Scheduled

## 2023-09-06 DIAGNOSIS — I10 Essential (primary) hypertension: Secondary | ICD-10-CM | POA: Diagnosis not present

## 2023-09-06 DIAGNOSIS — I700 Atherosclerosis of aorta: Secondary | ICD-10-CM | POA: Diagnosis not present

## 2023-09-06 DIAGNOSIS — M1990 Unspecified osteoarthritis, unspecified site: Secondary | ICD-10-CM | POA: Diagnosis not present

## 2023-09-06 DIAGNOSIS — G8929 Other chronic pain: Secondary | ICD-10-CM | POA: Diagnosis not present

## 2023-09-06 DIAGNOSIS — K5900 Constipation, unspecified: Secondary | ICD-10-CM | POA: Diagnosis not present

## 2023-09-06 DIAGNOSIS — I2510 Atherosclerotic heart disease of native coronary artery without angina pectoris: Secondary | ICD-10-CM | POA: Diagnosis not present

## 2023-09-06 DIAGNOSIS — Z87891 Personal history of nicotine dependence: Secondary | ICD-10-CM | POA: Diagnosis not present

## 2023-09-06 DIAGNOSIS — H3530 Unspecified macular degeneration: Secondary | ICD-10-CM | POA: Diagnosis not present

## 2023-09-06 DIAGNOSIS — H9193 Unspecified hearing loss, bilateral: Secondary | ICD-10-CM | POA: Diagnosis not present

## 2023-09-06 DIAGNOSIS — C61 Malignant neoplasm of prostate: Secondary | ICD-10-CM | POA: Diagnosis not present

## 2023-09-15 ENCOUNTER — Other Ambulatory Visit: Payer: PPO

## 2023-09-15 DIAGNOSIS — C61 Malignant neoplasm of prostate: Secondary | ICD-10-CM | POA: Diagnosis not present

## 2023-09-16 LAB — COMPREHENSIVE METABOLIC PANEL
ALT: 11 [IU]/L (ref 0–44)
AST: 18 [IU]/L (ref 0–40)
Albumin: 4.4 g/dL (ref 3.6–4.6)
Alkaline Phosphatase: 87 [IU]/L (ref 44–121)
BUN/Creatinine Ratio: 32 — ABNORMAL HIGH (ref 10–24)
BUN: 25 mg/dL (ref 10–36)
Bilirubin Total: 0.4 mg/dL (ref 0.0–1.2)
CO2: 23 mmol/L (ref 20–29)
Calcium: 9.7 mg/dL (ref 8.6–10.2)
Chloride: 99 mmol/L (ref 96–106)
Creatinine, Ser: 0.79 mg/dL (ref 0.76–1.27)
Globulin, Total: 2.8 g/dL (ref 1.5–4.5)
Glucose: 140 mg/dL — ABNORMAL HIGH (ref 70–99)
Potassium: 4.2 mmol/L (ref 3.5–5.2)
Sodium: 142 mmol/L (ref 134–144)
Total Protein: 7.2 g/dL (ref 6.0–8.5)
eGFR: 84 mL/min/{1.73_m2} (ref >59)

## 2023-09-16 LAB — PSA: Prostate Specific Ag, Serum: 87.8 ng/mL — ABNORMAL HIGH (ref 0.0–4.0)

## 2023-09-16 LAB — TESTOSTERONE: Testosterone: 59 ng/dL — ABNORMAL LOW (ref 264–916)

## 2023-09-21 ENCOUNTER — Ambulatory Visit: Payer: PPO | Admitting: Urology

## 2023-09-21 VITALS — BP 164/66 | HR 91

## 2023-09-21 DIAGNOSIS — C61 Malignant neoplasm of prostate: Secondary | ICD-10-CM

## 2023-09-21 DIAGNOSIS — N403 Nodular prostate with lower urinary tract symptoms: Secondary | ICD-10-CM | POA: Diagnosis not present

## 2023-09-21 DIAGNOSIS — R350 Frequency of micturition: Secondary | ICD-10-CM

## 2023-09-21 DIAGNOSIS — R972 Elevated prostate specific antigen [PSA]: Secondary | ICD-10-CM | POA: Diagnosis not present

## 2023-09-21 DIAGNOSIS — E291 Testicular hypofunction: Secondary | ICD-10-CM

## 2023-09-21 NOTE — Progress Notes (Signed)
Subjective: 1. Prostate cancer (HCC)   2. Elevated PSA   3. Nodular prostate with lower urinary tract symptoms   4. Frequent urination   5. Hypogonadism in male    09/21/23: Andrew [name] returns today in f/u.  He Andrew [name] with the last dose was on 08/24/22 and his PSA is down to 87.8 from 111.0 at last check.  His T is 26 which is up from 44 at his visit in November.  HE is doing ok but has a sensation of incomplete emptying with intermittency and straining.  He is very weak and fatigued on treatment.  He has no bone pain but he has some chronic back pain.  HE has no hot flashes.  His CMP was remarkable only for a glucose of 140 on 09/15/23.    05/25/23: Andrew [name] returns today in f/u.  His PSA is up to 145 from 92.  Over the past month he has had increased frequency and nocturia.  He has some urgency with UUI.  He has hesitancy with intermittency and a reduced stream.  He may have lost a couple of pounds.  He has back pain that is chronic.  His testosterone level is 201. His PVR is .  HIs IPSS is up to 26.  His CMP is ok.      01/05/23: Andrew [name] returns today in f/u for his history below.  His PSA is down slightly to 91.7 from 98.1.   His IPSS is 15 with nocturia x 1 and he is content with his voiding symptoms. He has lost a little weight but has gained some back.  He has arthralgias but no other new pains.  He has no GI complaints.  His testosterone level is down some at 163.   His CMP is unremarkable.  HIs UA is clear.    09/29/22: Andrew [name] returns today in f/u for his history of prostate cancer found by PSMA PET.  His PSA is up to 98.1 from 65.9 in 10/23.  A repeat PET in 11/23 still only showed uptake in the prostate and no metastases.  His Alk phos was normal in 10/23 and [facility] R.  HIs UA is clear today and he has variable LUTS.  He has lost about 5 lb since his last visit. He has joint pain but no bone pain.  He has no GI complaints.   06/23/22: Andrew [name] returns today in f/u for his clinical diagnosis of prostate  cancer with prostate uptake and no mets on a PSMA PET on 01/20/22.  His PSA has been rising and was 65.9 on 06/16/22 which was up from 61.4 in 9/23 and 39.8 in 5/23.  He remains weak and fell recently and bruised his left eye brow.  His weight is down slightly.  He has no bone pain but has some shoulder arthritis and low back pain.  He thinks he is voiding better with an IPSS of 11 and nocturia x 3. .    05/12/22: Andrew [name] returns today in f/u.  His PSA is up to 61.4 on 05/03/22 from 39.8 onm 01/06/22.  He had a PET scan on 01/20/22 that showed only prostate uptake and no mets.  He continues to void ok with an IPSS of 14.  He has nocturia about 4x night.  His weight is stable.  He has had no bone pain.  He remains fairly feeble.   5/18/23[name] returns today at the request of Dr. Neita Carp for a further rise in his PSA to 35.  He has  had progressive LUTS with a reduced stream with frequency  and urgency.  He has gotten progressively weak and is down 3-4 lbs.  He has no bone pain but has arthritis.   He has had no hematuria or dysuria.  He is on Vit D for a low level and he had a slight anemia.    04/10/20: Andrew [name] returns today in f/u for his history of nocturia.   He reports that the nocturia is down to 3-4x from 4-5x. He didn't stop the coke in the evenings and drinks a lot of tea in the daytime.  His IPSS is 11.   He did a voiding diary and >50% of his UOP is at night.  His labs in July from Dr. Neita Carp had a Cr of 0.9 and a sodium of 137.   His PSA was up to 9.9 from 6.7 over the past year.  He thinks it was about a 5 prior to that.   He has a reduced stream but is emptying well with a PVR of about 10ml.    02/21/20: Andrew [name] is an 89 yo male who is sent by Dr. Neita Carp for BPH with BOO.   He has nocturia q2-3hrs.  He can have some hesitancy.  He doesn't feel he empties completely and has some intermittency.  He has no issues with urgency.  He has no daytime frequency.  He has a reduced stream.  He was given tamsulosin in the  past for nocturia but it  made him dizzy so he stopped it after just a few days.   He has had no UTI's.  He has had no stones or GU surgery.    He was unable to get a urine today.  ROS:  Review of Systems  Constitutional:  Positive for malaise/fatigue.  Respiratory:  Positive for shortness of breath.   Musculoskeletal:  Positive for back pain and joint pain.  Neurological:  Positive for weakness.  All other systems reviewed and are negative.   No Known Allergies  Past Medical History:  Diagnosis Date   Arthritis    BPH (benign prostatic hyperplasia)    Carotid artery stenosis    a. s/p L CEA in 03/2017   Hypertension     Past Surgical History:  Procedure Laterality Date   CAROTID ENDARTERECTOMY Left 04/17/2017   COLONOSCOPY N/A 01/19/2017   Procedure: COLONOSCOPY;  Surgeon: Corbin Ade, MD;  Location: [facility];  Service: Endoscopy;  Laterality: N/A;  1:15pm   ENDARTERECTOMY Left 04/17/2017   Procedure: ENDARTERECTOMY CAROTID - LEFT;  Surgeon: Fransisco Hertz, MD;  Location: [facility] OR;  Service: Vascular;  Laterality: Left;   ESOPHAGOGASTRODUODENOSCOPY N/A 01/19/2017   Procedure: ESOPHAGOGASTRODUODENOSCOPY (EGD);  Surgeon: Corbin Ade, MD;  Location: [facility];  Service: Endoscopy;  Laterality: N/A;   PATCH ANGIOPLASTY Left 04/17/2017   Procedure: PATCH ANGIOPLASTY;  Surgeon: Fransisco Hertz, MD;  Location: [facility] OR;  Service: Vascular;  Laterality: Left;    Social History   Socioeconomic History   Marital status: Married    Spouse name: Not on file   Number of children: 1   Years of education: Not on file   Highest education level: Not on file  Occupational History   Occupation: retired  Tobacco Use   Smoking status: Former    Current packs/day: 0.00    Types: Cigarettes    Start date: 1950    Quit date: 1980    Years since quitting: 45.1   Smokeless  tobacco: Never  Vaping Use   Vaping status: Never Used  Substance and Sexual Activity   Alcohol use: No    Drug use: No   Sexual activity: Never  Other Topics Concern   Not on file  Social History Narrative   Not on file   Social Drivers of Health   Financial Resource Strain: Not on file  Food Insecurity: Not on file  Transportation Needs: Not on file  Physical Activity: Not on file  Stress: Not on file  Social Connections: Not on file  Intimate Partner Violence: Not on file    Family History  Problem Relation Age of Onset   Hypertension Father    Colon cancer Neg Hx    Colon polyps Neg Hx     Anti-infectives: Anti-infectives (From admission, onward)    None       Current Outpatient Medications  Medication Sig Dispense Refill   acetaminophen (TYLENOL) 500 MG tablet Take 1,000 mg by mouth 2 (two) times daily as needed for moderate pain.     ascorbic acid (VITAMIN C) 500 MG tablet Take by mouth.     Docusate Sodium (DSS) 100 MG CAPS Take by mouth.     losartan (COZAAR) 50 MG tablet Take 50 mg by mouth daily.     Multiple Vitamins-Minerals (ICAPS LUTEIN & OMEGA-3) CAPS Take 1 capsule by mouth daily.     senna-docusate (SENOKOT-S) 8.6-50 MG tablet Take by mouth.     traMADol (ULTRAM) 50 MG tablet Take 50 mg by mouth every 6 (six) hours as needed.     VITAMIN D PO Take by mouth.     calcium citrate (CALCITRATE - DOSED IN MG ELEMENTAL CALCIUM) 950 (200 Ca) MG tablet Take by mouth. (Patient not taking: Reported on 09/21/2023)     HYDROcodone-acetaminophen (NORCO) 7.5-325 MG tablet Take 1 tablet by mouth every 6 (six) hours as needed. (Patient not taking: Reported on 09/21/2023)     metoprolol succinate (TOPROL-XL) 25 MG 24 hr tablet Take by mouth. (Patient not taking: Reported on 09/21/2023)     Current Facility-Administered Medications  Medication Dose Route Frequency Provider Last Rate Last Admin   degarelix (FIRMAGON) injection 240 mg  240 mg Subcutaneous Once          Objective: Vital signs in last 24 hours: BP (!) 164/66   Pulse 91   Intake/Output from previous  day: No intake/output data recorded. Intake/Output this shift: @IOTHISSHIFT @   Physical Exam Vitals reviewed.  Constitutional:      Appearance: Normal appearance.  Neurological:     Mental Status: He is alert.     Lab Results:  No results found for this or any previous visit (from the past 24 hours).  Lab Results  Component Value Date   PSA1 87.8 (H) 09/15/2023   PSA1 111.0 (H) 06/23/2023   PSA1 145.0 (H) 05/17/2023    Recent Results (from the past 2160 hours)  PSA     Status: Abnormal   Collection Time: 09/15/23  8:24 AM  Result Value Ref Range   Prostate Specific Ag, Serum 87.8 (H) 0.0 - 4.0 ng/mL    Comment: Roche ECLIA methodology. According to the American Urological Association, Serum PSA should decrease and remain at undetectable levels after radical prostatectomy. The AUA defines biochemical recurrence as an initial PSA value 0.2 ng/mL or greater followed by a subsequent confirmatory PSA value 0.2 ng/mL or greater. Values obtained with different assay methods or kits cannot be used interchangeably. Results cannot be  interpreted as absolute evidence of the presence or absence of malignant disease.   Testosterone     Status: Abnormal   Collection Time: 09/15/23  8:24 AM  Result Value Ref Range   Testosterone 59 (L) 264 - 916 ng/dL    Comment: Adult male reference interval is based on a population of healthy nonobese males (BMI <30) between 37 and 9 years old. Travison, et.al. JCEM [phone]. PMID: [id number].   Comprehensive metabolic panel     Status: Abnormal   Collection Time: 09/15/23  8:24 AM  Result Value Ref Range   Glucose 140 (H) 70 - 99 mg/dL   BUN 25 10 - 36 mg/dL   Creatinine, Ser 8.46 0.76 - 1.27 mg/dL   eGFR 84 >96 EX/BMW/4.13   BUN/Creatinine Ratio 32 (H) 10 - 24   Sodium 142 134 - 144 mmol/L   Potassium 4.2 3.5 - 5.2 mmol/L   Chloride 99 96 - 106 mmol/L   CO2 23 20 - 29 mmol/L   Calcium 9.7 8.6 - 10.2 mg/dL   Total Protein 7.2  6.0 - 8.5 g/dL   Albumin 4.4 3.6 - 4.6 g/dL   Globulin, Total 2.8 1.5 - 4.5 g/dL   Bilirubin Total 0.4 0.0 - 1.2 mg/dL   Alkaline Phosphatase 87 44 - 121 IU/L   AST 18 0 - 40 IU/L   ALT 11 0 - 44 IU/L       BMET No results for input(s): "NA", "K", "CL", "CO2", "GLUCOSE", "BUN", "CREATININE", "CALCIUM" in the last 72 hours. PT/INR No results for input(s): "LABPROT", "INR" in the last 72 hours. ABG No results for input(s): "PHART", "HCO3" in the last 72 hours.  Invalid input(s): "PCO2", "PO2"  Studies/Results: PSMA PET report and films reviewed.    Assessment/Plan: Elevated PSA with right prostate induration and uptake in the prostate on the PSMA PET which is consistent with a localized prostate cancer.   His PSA is down to 87.8 on firmagon from 145 on 05/17/23 prior to therapy.  He has persistent LUTS.   I will continue the firmagon and have him f/u in 3 months with a PSA but the rate of PSA decline is slower than I would generally like to see and his testosterone level is 59 which is not quite castrate.  I will have him see Dr. Ellin Saba to consider second line agents. .   Hypogonadism.   His T was down to 42 on 06/23/23 but is back up to 59 on firmagon.   Nodular prostate with LUTS.   He is voiding ok but has some persistent obstructive symptoms.   No orders of the defined types were placed in this encounter.    Orders Placed This Encounter  Procedures   Urinalysis, Routine w reflex microscopic   PSA    Standing Status:   Future    Expected Date:   12/20/2023    Expiration Date:   03/20/2024   Testosterone    Standing Status:   Future    Expected Date:   12/20/2023    Expiration Date:   03/20/2024   Ambulatory referral to Hematology / Oncology    Referral Priority:   Routine    Referral Type:   Consultation    Referral Reason:   Specialty Services Required    Requested Specialty:   Oncology    Number of Visits Requested:   1     Return in about 3 months (around  12/20/2023) for with labs.   continue firmagon  monthly.  Try to schedule the 3 month visit on a firmagon day. .    CC: Dr. Fara Chute.      Bjorn Pippin 09/22/2023 [phone] ID: Lianne Cure, male   DOB: 04-Apr-1931, 89 y.o.   MRN: [id number]

## 2023-09-22 ENCOUNTER — Encounter: Payer: Self-pay | Admitting: Urology

## 2023-09-25 ENCOUNTER — Ambulatory Visit: Payer: PPO

## 2023-09-25 DIAGNOSIS — C61 Malignant neoplasm of prostate: Secondary | ICD-10-CM

## 2023-09-25 MED ORDER — DEGARELIX ACETATE 80 MG ~~LOC~~ SOLR
80.0000 mg | Freq: Once | SUBCUTANEOUS | Status: AC
Start: 2023-09-25 — End: 2023-09-25
  Administered 2023-09-25: 80 mg via SUBCUTANEOUS

## 2023-09-25 NOTE — Progress Notes (Signed)
IM  injection  Medication: Deborra Medina  Dose: 80 Location: left abdomen Lot: [id number] Exp: 06/22/2025  Patient tolerated well, no complications were noted  Performed by: Kennyth Lose, CMA

## 2023-09-26 NOTE — Progress Notes (Signed)
 [facility] [address] [place] [address]   Clinic Day:  09/27/2023  Referring physician: Watt Rush, MD  Patient Care Team: Atilano Deward ORN, MD as PCP - General (Family Medicine) Charls Pearla LABOR, MD (Inactive) as PCP - Cardiology (Cardiology) Shaaron Lamar HERO, MD as Consulting Physician (Gastroenterology) Rogers Hai, MD as Medical Oncologist (Medical Oncology) Celestia Joesph SQUIBB, RN as Oncology Nurse Navigator (Medical Oncology)   ASSESSMENT & PLAN:   Assessment:  1.  [name]: - Patient seen at the request of Dr. Watt - Clinical diagnosis of prostate cancer was made based on PET scan from 01/20/2022 that showed prostate only uptake and no metastasis.  PSA 39.8. - PSMA PET (06/30/2022): Marked activity in the paramedian left lobe of the prostate.  No evidence of metastatic disease. - Bone scan (06/01/2023): Increased activity in the sternomanubrial region, bilateral shoulders, elbows, wrists and knees probably degenerative.  No definite bone metastasis. - Firmagon  started on 05/31/2023 for elevated PSA of 145 (05/17/2023) - PSA improved to 111 (06/23/2023), and 87.8 (09/15/2023), testosterone  59  2.  Social/family history: - Lives at home with his wife.  Daughter stays with him and helps him with her day-to-day activities.  He uses walker to ambulate due to arthritis and decreased strength.  He is mostly sitting in a chair during daytime watching TV and gets up every 2-3 hours.  He cannot stand more than 3 minutes at a time.  He needs help with day-to-day activities including wearing his pants.  He fell about 2-3 times in the last 5 years.  He quit smoking in 1980.  No family history of malignancy that he could remember.  Plan:  1.  [name]: - Today patient referred to me by Dr. Watt as the PSA decline is lower since Firmagon  started and his testosterone  level was still elevated at 59 on  09/15/2023. - We have discussed second line agents including abiraterone  and darolutamide. - At this time his functional status is not sufficient to consider starting second line agents.  If we can improve his fatigue, I will reconsider it.  I plan to check PSA and testosterone  again in 4 weeks.  2.  Severe fatigue: - Reported worsening tiredness in the last 1 to 2 months. - Will check his CBC, ferritin, iron panel, B12, folic acid , copper  and methylmalonic acid levels.  Will also check TSH level.   Orders Placed This Encounter  Procedures   CBC with Differential    Standing Status:   Future    Number of Occurrences:   1    Expected Date:   09/27/2023    Expiration Date:   09/26/2024   Lactate dehydrogenase    Standing Status:   Future    Number of Occurrences:   1    Expected Date:   09/27/2023    Expiration Date:   09/26/2024   Ferritin    Standing Status:   Future    Number of Occurrences:   1    Expected Date:   09/27/2023    Expiration Date:   09/26/2024   Iron and TIBC ([facility] ONLY)    Standing Status:   Future    Number of Occurrences:   1    Expected Date:   09/27/2023    Expiration Date:   09/26/2024   Reticulocytes    Standing Status:   Future    Number of Occurrences:   1  Expected Date:   09/27/2023    Expiration Date:   09/26/2024   Vitamin B12    Standing Status:   Future    Number of Occurrences:   1    Expected Date:   09/27/2023    Expiration Date:   09/26/2024   Folate    Standing Status:   Future    Number of Occurrences:   1    Expected Date:   09/27/2023    Expiration Date:   09/26/2024   Methylmalonic acid, serum    Standing Status:   Future    Number of Occurrences:   1    Expected Date:   09/27/2023    Expiration Date:   09/26/2024   Protein electrophoresis, serum    Standing Status:   Future    Number of Occurrences:   1    Expected Date:   09/27/2023    Expiration Date:   09/26/2024   TSH    Standing Status:   Future    Number of Occurrences:   1     Expected Date:   09/27/2023    Expiration Date:   09/26/2024   Copper , serum    Standing Status:   Future    Number of Occurrences:   1    Expected Date:   09/27/2023    Expiration Date:   09/26/2024   PSA    Standing Status:   Future    Expected Date:   10/18/2023    Expiration Date:   09/26/2024   Testosterone     Standing Status:   Future    Expected Date:   10/18/2023    Expiration Date:   09/26/2024   Ambulatory referral to Genetics    Referral Priority:   Routine    Referral Type:   Consultation    Referral Reason:   Specialty Services Required    Number of Visits Requested:   1      I,Helena R Teague,acting as a scribe for Alean Stands, MD.,have documented all relevant documentation on the behalf of Alean Stands, MD,as directed by  Alean Stands, MD while in the presence of Alean Stands, MD.   I, Alean Stands MD, have reviewed the above documentation for accuracy and completeness, and I agree with the above.   Alean Stands, MD   2/5/20252:13 PM  CHIEF COMPLAINT/PURPOSE OF CONSULT:   Diagnosis: Prostate Cancer   Cancer Staging  No matching staging information was found for the patient.    Prior Therapy: None  Current Therapy:  Firmagon  80 mg monthly   HISTORY OF PRESENT ILLNESS:   Oncology History   No history exists.      Andrew [name] is a 89 y.o. male presenting to clinic today for evaluation of prostate cancer at the request of Dr. Watt.  Patient was diagnosed with prostate cancer on 01/06/2022 from persistent elevated PSA, which was 35 at that time. He has been receiving Firmagon  injections monthly since 05/17/23. His last PSA was down to 87.8 from 145 (prior to starting therapy). His last testosterone  was at 59. This is in the setting of persistent LUTS. He was referred to me for possible second line agents. PET PSMA from 2023 did not show metastatic disease.   He underwent a NM whole body scan on 06/01/23 that found:  Multifocal activity which is probably degenerative. No definite osseous metastatic disease.  Today, he states that he is doing well overall. His appetite level is at 75%. His energy level is  at 10%.  PAST MEDICAL HISTORY:   Past Medical History: Past Medical History:  Diagnosis Date   Arthritis    BPH (benign prostatic hyperplasia)    Carotid artery stenosis    a. s/p L CEA in 03/2017   Hypertension     Surgical History: Past Surgical History:  Procedure Laterality Date   CAROTID ENDARTERECTOMY Left 04/17/2017   COLONOSCOPY N/A 01/19/2017   Procedure: COLONOSCOPY;  Surgeon: Shaaron Lamar HERO, MD;  Location[facility];  Service: Endoscopy;  Laterality: N/A;  1:15pm   ENDARTERECTOMY Left 04/17/2017   Procedure: ENDARTERECTOMY CAROTID - LEFT;  Surgeon: Laurence Redell CROME, MD;  Location: [facility] OR;  Service: Vascular;  Laterality: Left;   ESOPHAGOGASTRODUODENOSCOPY N/A 01/19/2017   Procedure: ESOPHAGOGASTRODUODENOSCOPY (EGD);  Surgeon: Shaaron Lamar HERO, MD;  Location: [facility];  Service: Endoscopy;  Laterality: N/A;   PATCH ANGIOPLASTY Left 04/17/2017   Procedure: PATCH ANGIOPLASTY;  Surgeon: Laurence Redell CROME, MD;  Location: [facility] OR;  Service: Vascular;  Laterality: Left;    Social History: Social History   Socioeconomic History   Marital status: Married    Spouse name: Not on file   Number of children: 1   Years of education: Not on file   Highest education level: Not on file  Occupational History   Occupation: retired  Tobacco Use   Smoking status: Former    Current packs/day: 0.00    Types: Cigarettes    Start date: 1950    Quit date: 1980    Years since quitting: 45.1   Smokeless tobacco: Never  Vaping Use   Vaping status: Never Used  Substance and Sexual Activity   Alcohol use: No   Drug use: No   Sexual activity: Never  Other Topics Concern   Not on file  Social History Narrative   Not on file   Social Drivers of Health   Financial Resource Strain: Not on file   Food Insecurity: No Food Insecurity (09/27/2023)   Hunger Vital Sign    Worried About Running Out of Food in the Last Year: Never true    Ran Out of Food in the Last Year: Never true  Transportation Needs: No Transportation Needs (09/27/2023)   PRAPARE - Administrator, Civil Service (Medical): No    Lack of Transportation (Non-Medical): No  Physical Activity: Not on file  Stress: Not on file  Social Connections: Not on file  Intimate Partner Violence: Not At Risk (09/27/2023)   Humiliation, Afraid, Rape, and Kick questionnaire    Fear of Current or Ex-Partner: No    Emotionally Abused: No    Physically Abused: No    Sexually Abused: No    Family History: Family History  Problem Relation Age of Onset   Hypertension Father    Diabetes Brother    Colon cancer Neg Hx    Colon polyps Neg Hx     Current Medications:  Current Outpatient Medications:    acetaminophen  (TYLENOL ) 500 MG tablet, Take 1,000 mg by mouth 2 (two) times daily as needed for moderate pain., Disp: , Rfl:    ascorbic acid (VITAMIN C) 500 MG tablet, Take by mouth., Disp: , Rfl:    calcium citrate (CALCITRATE - DOSED IN MG ELEMENTAL CALCIUM) 950 (200 Ca) MG tablet, Take by mouth., Disp: , Rfl:    Docusate Sodium  (DSS) 100 MG CAPS, Take by mouth., Disp: , Rfl:    losartan  (COZAAR ) 50 MG tablet, Take 50 mg by mouth daily., Disp: ,  Rfl:    meloxicam (MOBIC) 7.5 MG tablet, Take 7.5 mg by mouth daily., Disp: , Rfl:    Multiple Vitamins-Minerals (ICAPS LUTEIN & OMEGA-3) CAPS, Take 1 capsule by mouth daily., Disp: , Rfl:    senna-docusate (SENOKOT-S) 8.6-50 MG tablet, Take by mouth., Disp: , Rfl:    traMADol (ULTRAM) 50 MG tablet, Take 50 mg by mouth every 6 (six) hours as needed., Disp: , Rfl:    VITAMIN D PO, Take by mouth., Disp: , Rfl:   Current Facility-Administered Medications:    degarelix  (FIRMAGON ) injection 240 mg, 240 mg, Subcutaneous, Once,    Allergies: No Known Allergies  REVIEW OF SYSTEMS:    Review of Systems  Constitutional:  Negative for chills, fatigue and fever.  HENT:   Negative for lump/mass, mouth sores, nosebleeds, sore throat and trouble swallowing.   Eyes:  Negative for eye problems.  Respiratory:  Positive for shortness of breath. Negative for cough.   Cardiovascular:  Negative for chest pain, leg swelling and palpitations.  Gastrointestinal:  Positive for constipation. Negative for abdominal pain, diarrhea, nausea and vomiting.  Genitourinary:  Negative for bladder incontinence, difficulty urinating, dysuria, frequency, hematuria and nocturia.   Musculoskeletal:  Positive for arthralgias (Left knee). Negative for back pain, flank pain, myalgias and neck pain.  Skin:  Negative for itching and rash.  Neurological:  Positive for numbness. Negative for dizziness and headaches.  Hematological:  Does not bruise/bleed easily.  Psychiatric/Behavioral:  Negative for depression, sleep disturbance and suicidal ideas. The patient is not nervous/anxious.   All other systems reviewed and are negative.    VITALS:   Blood pressure (!) 143/76, pulse 87, temperature 97.7 F (36.5 C), temperature source Oral, resp. rate 20, weight 137 lb 12.6 oz (62.5 kg), SpO2 99%.  Wt Readings from Last 3 Encounters:  09/27/23 137 lb 12.6 oz (62.5 kg)  05/25/23 146 lb 9.6 oz (66.5 kg)  09/29/22 146 lb 9.6 oz (66.5 kg)    Body mass index is 19.22 kg/m.  Performance status (ECOG): 2 - Symptomatic, <50% confined to bed  PHYSICAL EXAM:   Physical Exam Vitals and nursing note reviewed. Exam conducted with a chaperone present.  Constitutional:      Appearance: Normal appearance.  Cardiovascular:     Rate and Rhythm: Normal rate and regular rhythm.     Pulses: Normal pulses.     Heart sounds: Normal heart sounds.  Pulmonary:     Effort: Pulmonary effort is normal.     Breath sounds: Normal breath sounds.  Abdominal:     Palpations: Abdomen is soft. There is no hepatomegaly,  splenomegaly or mass.     Tenderness: There is no abdominal tenderness.  Musculoskeletal:     Right lower leg: No edema.     Left lower leg: No edema.  Lymphadenopathy:     Cervical: No cervical adenopathy.     Right cervical: No superficial, deep or posterior cervical adenopathy.    Left cervical: No superficial, deep or posterior cervical adenopathy.     Upper Body:     Right upper body: No supraclavicular or axillary adenopathy.     Left upper body: No supraclavicular or axillary adenopathy.  Neurological:     General: No focal deficit present.     Mental Status: He is alert and oriented to person, place, and time.  Psychiatric:        Mood and Affect: Mood normal.        Behavior: Behavior normal.  LABS:   CBC    Component Value Date/Time   WBC 10.5 10/02/2017 0517   RBC 3.62 (L) 10/02/2017 0517   HGB 11.0 (L) 10/02/2017 0517   HCT 33.4 (L) 10/02/2017 0517   PLT 217 10/02/2017 0517   MCV 92.3 10/02/2017 0517   MCH 30.4 10/02/2017 0517   MCHC 32.9 10/02/2017 0517   RDW 13.9 10/02/2017 0517   LYMPHSABS 2.1 10/01/2017 1221   MONOABS 0.9 10/01/2017 1221   EOSABS 0.1 10/01/2017 1221   BASOSABS 0.0 10/01/2017 1221    CMP    Component Value Date/Time   NA 142 09/15/2023 0824   K 4.2 09/15/2023 0824   CL 99 09/15/2023 0824   CO2 23 09/15/2023 0824   GLUCOSE 140 (H) 09/15/2023 0824   GLUCOSE 104 (H) 10/02/2017 0323   BUN 25 09/15/2023 0824   CREATININE 0.79 09/15/2023 0824   CALCIUM 9.7 09/15/2023 0824   PROT 7.2 09/15/2023 0824   ALBUMIN 4.4 09/15/2023 0824   AST 18 09/15/2023 0824   ALT 11 09/15/2023 0824   ALKPHOS 87 09/15/2023 0824   BILITOT 0.4 09/15/2023 0824   GFRNONAA >60 10/02/2017 0323   GFRAA >60 10/02/2017 0323     No results found for: CEA1, CEA / No results found for: CEA1, CEA Lab Results  Component Value Date   PSA1 87.8 (H) 09/15/2023   No results found for: CAN199 No results found for: CAN125  No results found for:  TOTALPROTELP, ALBUMINELP, A1GS, A2GS, BETS, BETA2SER, GAMS, MSPIKE, SPEI No results found for: TIBC, FERRITIN, IRONPCTSAT No results found for: LDH   STUDIES:   No results found.

## 2023-09-27 ENCOUNTER — Inpatient Hospital Stay: Payer: PPO

## 2023-09-27 ENCOUNTER — Encounter: Payer: Self-pay | Admitting: Hematology

## 2023-09-27 ENCOUNTER — Inpatient Hospital Stay: Payer: PPO | Attending: Hematology | Admitting: Hematology

## 2023-09-27 VITALS — BP 143/76 | HR 87 | Temp 97.7°F | Resp 20 | Wt 137.8 lb

## 2023-09-27 DIAGNOSIS — C61 Malignant neoplasm of prostate: Secondary | ICD-10-CM | POA: Diagnosis not present

## 2023-09-27 DIAGNOSIS — Z7952 Long term (current) use of systemic steroids: Secondary | ICD-10-CM | POA: Insufficient documentation

## 2023-09-27 DIAGNOSIS — R5383 Other fatigue: Secondary | ICD-10-CM | POA: Diagnosis not present

## 2023-09-27 DIAGNOSIS — Z87891 Personal history of nicotine dependence: Secondary | ICD-10-CM | POA: Diagnosis not present

## 2023-09-27 DIAGNOSIS — Z79899 Other long term (current) drug therapy: Secondary | ICD-10-CM | POA: Diagnosis not present

## 2023-09-27 LAB — CBC WITH DIFFERENTIAL/PLATELET
Abs Immature Granulocytes: 0.04 10*3/uL (ref 0.00–0.07)
Basophils Absolute: 0.1 10*3/uL (ref 0.0–0.1)
Basophils Relative: 1 %
Eosinophils Absolute: 0.2 10*3/uL (ref 0.0–0.5)
Eosinophils Relative: 2 %
HCT: 36.9 % — ABNORMAL LOW (ref 39.0–52.0)
Hemoglobin: 12.2 g/dL — ABNORMAL LOW (ref 13.0–17.0)
Immature Granulocytes: 0 %
Lymphocytes Relative: 16 %
Lymphs Abs: 1.7 10*3/uL (ref 0.7–4.0)
MCH: 31.4 pg (ref 26.0–34.0)
MCHC: 33.1 g/dL (ref 30.0–36.0)
MCV: 94.9 fL (ref 80.0–100.0)
Monocytes Absolute: 1.1 10*3/uL — ABNORMAL HIGH (ref 0.1–1.0)
Monocytes Relative: 10 %
Neutro Abs: 7.7 10*3/uL (ref 1.7–7.7)
Neutrophils Relative %: 71 %
Platelets: 285 10*3/uL (ref 150–400)
RBC: 3.89 MIL/uL — ABNORMAL LOW (ref 4.22–5.81)
RDW: 12.1 % (ref 11.5–15.5)
WBC: 10.8 10*3/uL — ABNORMAL HIGH (ref 4.0–10.5)
nRBC: 0 % (ref 0.0–0.2)

## 2023-09-27 LAB — IRON AND TIBC
Iron: 43 ug/dL — ABNORMAL LOW (ref 45–182)
Saturation Ratios: 14 % — ABNORMAL LOW (ref 17.9–39.5)
TIBC: 314 ug/dL (ref 250–450)
UIBC: 271 ug/dL

## 2023-09-27 LAB — FOLATE: Folate: 19.1 ng/mL (ref >5.9)

## 2023-09-27 LAB — RETICULOCYTES
Immature Retic Fract: 9.6 % (ref 2.3–15.9)
RBC.: 3.76 MIL/uL — ABNORMAL LOW (ref 4.22–5.81)
Retic Count, Absolute: 36.1 10*3/uL (ref 19.0–186.0)
Retic Ct Pct: 1 % (ref 0.4–3.1)

## 2023-09-27 LAB — VITAMIN B12: Vitamin B-12: 235 pg/mL (ref 180–914)

## 2023-09-27 LAB — TSH: TSH: 1.073 u[IU]/mL (ref 0.350–4.500)

## 2023-09-27 LAB — LACTATE DEHYDROGENASE: LDH: 137 U/L (ref 98–192)

## 2023-09-27 LAB — FERRITIN: Ferritin: 127 ng/mL (ref 24–336)

## 2023-09-27 NOTE — Patient Instructions (Addendum)
 [facility]  Discharge Instructions  You were seen and examined today by Dr. Rogers. Dr. Katragadda is a medical oncologist, meaning that he specializes in the treatment of cancer diagnoses. Dr. Rogers discussed your past medical history, family history of cancers, and the events that led to you being here today.  You were referred to Dr. Katragadda for ongoing management of your prostate cancer. Dr. Wrenn has started you on Firmagon  injections, you will continue those; however, your PSA has not responded as well as we would have hoped.  Dr. Katragadda does not recommend adding to your treatment regimen with additional pills due to your weakness.  Continue Firmagon  injections with Dr. Watt as scheduled.  Dr. Rogers has recommended additional labs today to see if you are anemic and if that is adding to your fatigue and weakness.  Dr. Rogers will also refer you to speak to a genetic counselor.  Follow-up as scheduled.  Thank you for choosing [facility] to provide your oncology and hematology care.   To afford each patient quality time with our provider, please arrive at least 15 minutes before your scheduled appointment time. You may need to reschedule your appointment if you arrive late (10 or more minutes). Arriving late affects you and other patients whose appointments are after yours.  Also, if you miss three or more appointments without notifying the office, you may be dismissed from the clinic at the provider's discretion.    Again, thank you for choosing [facility].  Our hope is that these requests will decrease the amount of time that you wait before being seen by our physicians.   If you have a lab appointment with the Cancer Center - please note that after April 8th, all labs will be drawn in the cancer center.  You do not have to check in or register with the main entrance as you have in the past  but will complete your check-in at the cancer center.            _____________________________________________________________  Should you have questions after your visit to [facility], please contact our office at [phone] and follow the prompts.  Our office hours are 8:00 a.m. to 4:30 p.m. Monday - Thursday and 8:00 a.m. to 2:30 p.m. Friday.  Please note that voicemails left after 4:00 p.m. may not be returned until the following business day.  We are closed weekends and all major holidays.  You do have access to a nurse 24-7, just call the main number to the clinic [phone] and do not press any options, hold on the line and a nurse will answer the phone.    For prescription refill requests, have your pharmacy contact our office and allow 72 hours.    Masks are no longer required in the cancer centers. If you would like for your care team to wear a mask while they are taking care of you, please let them know. You may have one support person who is at least 88 years old accompany you for your appointments.

## 2023-09-29 LAB — COPPER, SERUM: Copper: 84 ug/dL (ref 69–132)

## 2023-10-01 LAB — METHYLMALONIC ACID, SERUM: Methylmalonic Acid, Quantitative: 225 nmol/L (ref 0–378)

## 2023-10-03 LAB — PROTEIN ELECTROPHORESIS, SERUM
A/G Ratio: 1 (ref 0.7–1.7)
Albumin ELP: 3.7 g/dL (ref 2.9–4.4)
Alpha-1-Globulin: 0.3 g/dL (ref 0.0–0.4)
Alpha-2-Globulin: 1.1 g/dL — ABNORMAL HIGH (ref 0.4–1.0)
Beta Globulin: 1.3 g/dL (ref 0.7–1.3)
Gamma Globulin: 0.8 g/dL (ref 0.4–1.8)
Globulin, Total: 3.6 g/dL (ref 2.2–3.9)
Total Protein ELP: 7.3 g/dL (ref 6.0–8.5)

## 2023-10-05 ENCOUNTER — Encounter: Payer: Self-pay | Admitting: Genetic Counselor

## 2023-10-05 ENCOUNTER — Inpatient Hospital Stay (HOSPITAL_BASED_OUTPATIENT_CLINIC_OR_DEPARTMENT_OTHER): Payer: PPO | Admitting: Genetic Counselor

## 2023-10-05 DIAGNOSIS — Z800 Family history of malignant neoplasm of digestive organs: Secondary | ICD-10-CM | POA: Diagnosis not present

## 2023-10-05 DIAGNOSIS — C61 Malignant neoplasm of prostate: Secondary | ICD-10-CM

## 2023-10-05 NOTE — Progress Notes (Addendum)
REFERRING PROVIDER: Doreatha Massed, MD [address] [place],  [place] [address]  PRIMARY PROVIDER:  Estanislado Pandy, MD  PRIMARY REASON FOR VISIT:  1. Prostate cancer (HCC)   2. Family history of pancreatic cancer      HISTORY OF PRESENT ILLNESS:  I connected with  Mr. Wholey on 10/13/2023 at 10:30 AM EDT by MyChart video conference and verified that I am speaking with the correct person using two identifiers.   Patient location: [name]  Provider location: [facility]   Mr. Tomasetti, a 89 y.o. male, was seen for a [place] cancer genetics consultation at the request of Dr. Ellin Saba due to a personal and family history of cancer.  Mr. Cregg presents to clinic today to discuss the possibility of a hereditary predisposition to cancer, genetic testing, and to further clarify his future cancer risks, as well as potential cancer risks for family members.   In 2022, at the age of 40, Mr. Neiswonger was diagnosed with cancer of the prostate.  He is not metastatic,  he has not had a biopsy that I have found to provide a Gleason Score.      CANCER HISTORY:  Oncology History   No history exists.     Past Medical History:  Diagnosis Date   Arthritis    BPH (benign prostatic hyperplasia)    Carotid artery stenosis    a. s/p L CEA in 03/2017   Family history of pancreatic cancer    Hypertension     Past Surgical History:  Procedure Laterality Date   CAROTID ENDARTERECTOMY Left 04/17/2017   COLONOSCOPY N/A 01/19/2017   Procedure: COLONOSCOPY;  Surgeon: Corbin Ade, MD;  [facility];  Service: Endoscopy;  Laterality: N/A;  1:15pm   ENDARTERECTOMY Left 04/17/2017   Procedure: ENDARTERECTOMY CAROTID - LEFT;  Surgeon: Fransisco Hertz, MD;  Location: [facility] OR;  Service: Vascular;  Laterality: Left;   ESOPHAGOGASTRODUODENOSCOPY N/A 01/19/2017   Procedure: ESOPHAGOGASTRODUODENOSCOPY (EGD);  Surgeon: Corbin Ade, MD;  [facility];  Service: Endoscopy;  Laterality:  N/A;   PATCH ANGIOPLASTY Left 04/17/2017   Procedure: PATCH ANGIOPLASTY;  Surgeon: Fransisco Hertz, MD;  Location: [facility] OR;  Service: Vascular;  Laterality: Left;    Social History   Socioeconomic History   Marital status: Married    Spouse name: Not on file   Number of children: 1   Years of education: Not on file   Highest education level: Not on file  Occupational History   Occupation: retired  Tobacco Use   Smoking status: Former    Current packs/day: 0.00    Types: Cigarettes    Start date: 1950    Quit date: 1980    Years since quitting: 45.1   Smokeless tobacco: Never  Vaping Use   Vaping status: Never Used  Substance and Sexual Activity   Alcohol use: No   Drug use: No   Sexual activity: Never  Other Topics Concern   Not on file  Social History Narrative   Not on file   Social Drivers of Health   Financial Resource Strain: Not on file  Food Insecurity: No Food Insecurity (09/27/2023)   Hunger Vital Sign    Worried About Running Out of Food in the Last Year: Never true    Ran Out of Food in the Last Year: Never true  Transportation Needs: No Transportation Needs (09/27/2023)   PRAPARE - Administrator, Civil Service (Medical): No  Lack of Transportation (Non-Medical): No  Physical Activity: Not on file  Stress: Not on file  Social Connections: Not on file     FAMILY HISTORY:  We obtained a detailed, 4-generation family history.  Significant diagnoses are listed below: Family History  Problem Relation Age of Onset   Hypertension Father    Diabetes Brother    Parkinson's disease Brother    Pancreatic cancer Cousin        pat first cousin   Colon cancer Neg Hx    Colon polyps Neg Hx      The patient has one daughter who is cancer free.  The only cancer reported in the family is a paternal first cousin with pancreatic cancer.    Mr. Collister is unaware of previous family history of genetic testing for hereditary cancer risks.  There is no  reported Ashkenazi Jewish ancestry. There is no known consanguinity.  GENETIC COUNSELING ASSESSMENT: Mr. Sahli is a 89 y.o. male with a personal and family history of cancer which is somewhat suggestive of a sporadic to cancer given the lack of family history and his lower grade cancer. We, therefore, discussed and recommended the following at today's visit.   DISCUSSION: We discussed that, in general, most cancer is not inherited in families, but instead is sporadic or familial. Sporadic cancers occur by chance and typically happen at older ages (>50 years) as this type of cancer is caused by genetic changes acquired during an individual's lifetime. Some families have more cancers than would be expected by chance; however, the ages or types of cancer are not consistent with a known genetic mutation or known genetic mutations have been ruled out. This type of familial cancer is thought to be due to a combination of multiple genetic, environmental, hormonal, and lifestyle factors. While this combination of factors likely increases the risk of cancer, the exact source of this risk is not currently identifiable or testable.  We discussed that 5 - 10% of cancer is hereditary, with most cases of prostate cancer associated with BRCA mutations.  There are other genes that can be associated with hereditary prostate cancer syndromes.  These include CHEK2, PALB2 and HOXB13 among others.  We discussed that testing is beneficial for several reasons including knowing how to follow individuals after completing their treatment, identifying whether potential treatment options such as PARP inhibitors would be beneficial, and understand if other family members could be at risk for cancer and allow them to undergo genetic testing.   We reviewed the characteristics, features and inheritance patterns of hereditary cancer syndromes. We also discussed genetic testing, including the appropriate family members to test, the process  of testing, insurance coverage and turn-around-time for results. Individuals with prostate cancer and no family history of prostate, breast or ovarian cancer need to be metastatic or have high risk disease.  He does not have that. His family history of pancreatic cancer is not close enough to play a part in his risk assessment. We discussed with Mr. Brannan that the personal and family history does not meet insurance or NCCN criteria for genetic testing and, therefore, is not highly consistent with a familial hereditary cancer syndrome.  We feel he is at low risk to harbor a gene mutation associated with such a condition. Thus, we did not recommend any genetic testing, at this time, and recommended Mr. Steidle continue to follow the cancer screening guidelines given by his primary healthcare provider.  PLAN: Mr. Diekman did not wish to pursue genetic  testing at today's visit. We understand this decision and remain available to coordinate genetic testing at any time in the future. We, therefore, recommend Mr. Alsobrook continue to follow the cancer screening guidelines given by his primary healthcare provider.  Lastly, we encouraged Mr. Breisch to remain in contact with cancer genetics annually so that we can continuously update the family history and inform him of any changes in cancer genetics and testing that may be of benefit for this family.   Mr. Vaeth questions were answered to his satisfaction today. Our contact information was provided should additional questions or concerns arise. Thank you for the referral and allowing [place] to share in the care of your patient.   Sire Poet P. Lowell Guitar, MS, CGC Licensed, Patent attorney [email or web address] phone: [phone]  31 minutes were spent on the date of the encounter in service to the patient including preparation, face-to-face consultation, documentation and care coordination.  The patient brought his daughter. Drs. Meliton Rattan, and/or [place]  were available for questions, if needed..    _______________________________________________________________________ For Office Staff:  Number of people involved in session: 2 Was an Intern/ student involved with case: no

## 2023-10-16 ENCOUNTER — Inpatient Hospital Stay: Payer: PPO | Admitting: Hematology

## 2023-10-16 ENCOUNTER — Inpatient Hospital Stay: Payer: PPO

## 2023-10-16 VITALS — BP 166/77 | HR 76 | Temp 97.6°F | Resp 20 | Wt 141.5 lb

## 2023-10-16 DIAGNOSIS — C61 Malignant neoplasm of prostate: Secondary | ICD-10-CM | POA: Diagnosis not present

## 2023-10-16 DIAGNOSIS — R5381 Other malaise: Secondary | ICD-10-CM

## 2023-10-16 LAB — PSA: Prostatic Specific Antigen: 93.58 ng/mL — ABNORMAL HIGH (ref 0.00–4.00)

## 2023-10-16 NOTE — Progress Notes (Unsigned)
 Referral made to [facility] for PT services and was accepted.

## 2023-10-16 NOTE — Patient Instructions (Signed)
 [facility] at [facility] Discharge Instructions   You were seen and examined today by Dr. Ellin Saba.  He reviewed the results of your lab work which are normal/stable.   We will discuss at your next visit if we need to add pills to your treatment for prostate cancer. This will be based on the lab results from today.   Return as scheduled.    Thank you for choosing [facility] at [facility] to provide your oncology and hematology care.  To afford each patient quality time with our provider, please arrive at least 15 minutes before your scheduled appointment time.   If you have a lab appointment with the Cancer Center please come in thru the Main Entrance and check in at the main information desk.  You need to re-schedule your appointment should you arrive 10 or more minutes late.  We strive to give you quality time with our providers, and arriving late affects you and other patients whose appointments are after yours.  Also, if you no show three or more times for appointments you may be dismissed from the clinic at the providers discretion.     Again, thank you for choosing [facility].  Our hope is that these requests will decrease the amount of time that you wait before being seen by our physicians.       _____________________________________________________________  Should you have questions after your visit to [facility], please contact our office at [phone] and follow the prompts.  Our office hours are 8:00 a.m. and 4:30 p.m. Monday - Friday.  Please note that voicemails left after 4:00 p.m. may not be returned until the following business day.  We are closed weekends and major holidays.  You do have access to a nurse 24-7, just call the main number to the clinic [phone] and do not press any options, hold on the line and a nurse will answer the phone.    For prescription refill requests, have your  pharmacy contact our office and allow 72 hours.    Due to Covid, you will need to wear a mask upon entering the hospital. If you do not have a mask, a mask will be given to you at the Main Entrance upon arrival. For doctor visits, patients may have 1 support person age 56 or older with them. For treatment visits, patients can not have anyone with them due to social distancing guidelines and our immunocompromised population.

## 2023-10-16 NOTE — Progress Notes (Signed)
 [facility] [address] [place] [address]   Clinic Day:  10/17/2023  Referring physician: Estanislado Pandy, MD  Patient Care Team: Estanislado Pandy, MD as PCP - General (Family Medicine) Laqueta Linden, MD (Inactive) as PCP - Cardiology (Cardiology) Jena Gauss Gerrit Friends, MD as Consulting Physician (Gastroenterology) Doreatha Massed, MD as Medical Oncologist (Medical Oncology) Therese Sarah, RN as Oncology Nurse Navigator (Medical Oncology)   ASSESSMENT & PLAN:   Assessment:  1.  [name]: - Patient seen at the request of Dr. Annabell Howells - Clinical diagnosis of prostate cancer was made based on PET scan from 01/20/2022 that showed prostate only uptake and no metastasis.  PSA 39.8. - PSMA PET (06/30/2022): Marked activity in the paramedian left lobe of the prostate.  No evidence of metastatic disease. - Bone scan (06/01/2023): Increased activity in the sternomanubrial region, bilateral shoulders, elbows, wrists and knees probably degenerative.  No definite bone metastasis. - Firmagon started on 05/31/2023 for elevated PSA of 145 (05/17/2023) - PSA improved to 111 (06/23/2023), and 87.8 (09/15/2023), testosterone 59  2.  Social/family history: - Lives at home with his wife.  Daughter stays with him and helps him with her day-to-day activities.  He uses walker to ambulate due to arthritis and decreased strength.  He is mostly sitting in a chair during daytime watching TV and gets up every 2-3 hours.  He cannot stand more than 3 minutes at a time.  He needs help with day-to-day activities including wearing his pants.  He fell about 2-3 times in the last 5 years.  He quit smoking in 1980.  No family history of malignancy that he could remember.  Plan:  1.  Castrate resistant prostate cancer: - He did not report any improvement in his physical status from last visit. - We have sent PSA and testosterone level from today. - I have reviewed his  labs today which showed his PSA has increased to 93 from 87.  Testosterone is 52.  His disease become castrate resistant at this point. - We discussed starting him on abiraterone.  However because of his physical condition, we will start him at low-dose of 500 mg once daily.  He will also take prednisone 5 mg daily with breakfast. - We discussed side effects in detail. - RTC 3 weeks for follow-up with repeat labs. - We have also referred him for physical therapy evaluation and treatment at home. - I have discussed the plan with his daughter on the phone.  2.  Severe fatigue: - At last visit he reported worsening tiredness in the preceding 1 to 2 months. - We checked ferritin which was 127.  Folic acid and B12 are normal.  M spike was negative.  Hemoglobin was 12.2.  TSH was normal at 1.07. - Fatigue likely from [place].   Orders Placed This Encounter  Procedures   Ambulatory referral to Home Health    Referral Priority:   Routine    Referral Type:   Home Health Care    Referral Reason:   Specialty Services Required    Requested Specialty:   Home Health Services    Number of Visits Requested:   1     I,Helena R Teague,acting as a scribe for Doreatha Massed, MD.,have documented all relevant documentation on the behalf of Doreatha Massed, MD,as directed by  Doreatha Massed, MD while in the presence of Doreatha Massed, MD.  I, Doreatha Massed MD, have reviewed the above documentation  for accuracy and completeness, and I agree with the above.    Doreatha Massed, MD   2/25/20256:08 PM  CHIEF COMPLAINT/PURPOSE OF CONSULT:   Diagnosis: Prostate Cancer   Cancer Staging  Prostate cancer [facility]) Staging form: Prostate, AJCC 8th Edition - Clinical stage from 09/27/2023: cT2a, [name], [name], [name] - Unsigned    Prior Therapy: None  Current Therapy:  Firmagon 80 mg monthly   HISTORY OF PRESENT ILLNESS:   Oncology History   No history exists.      Andrew [name] is  a 89 y.o. male presenting to clinic today for evaluation of prostate cancer at the request of Dr. Annabell Howells.  Patient was diagnosed with prostate cancer on 01/06/2022 from persistent elevated PSA, which was 35 at that time. He has been receiving Firmagon injections monthly since 05/17/23. His last PSA was down to 87.8 from 145 (prior to starting therapy). His last testosterone was at 59. This is in the setting of persistent LUTS. He was referred to me for possible second line agents. PET PSMA from 2023 did not show metastatic disease.   He underwent a NM whole body scan on 06/01/23 that found: Multifocal activity which is probably degenerative. No definite osseous metastatic disease.  Today, he states that he is doing well overall. His appetite level is at 75%. His energy level is at 10%.  PAST MEDICAL HISTORY:   Andrew [name] is a 89 y.o. male presenting to the clinic today for follow-up of prostate cancer. He was last seen by me on 09/27/23 in consultation.  Today, he states that he is doing well overall. His appetite level is at 80%. His energy level is at 30%.   PAST MEDICAL HISTORY:   Past Medical History: Past Medical History:  Diagnosis Date   Arthritis    BPH (benign prostatic hyperplasia)    Carotid artery stenosis    a. s/p L CEA in 03/2017   Family history of pancreatic cancer    Hypertension     Surgical History: Past Surgical History:  Procedure Laterality Date   CAROTID ENDARTERECTOMY Left 04/17/2017   COLONOSCOPY N/A 01/19/2017   Procedure: COLONOSCOPY;  Surgeon: Corbin Ade, MD;  [facility];  Service: Endoscopy;  Laterality: N/A;  1:15pm   ENDARTERECTOMY Left 04/17/2017   Procedure: ENDARTERECTOMY CAROTID - LEFT;  Surgeon: Fransisco Hertz, MD;  Location: [facility] OR;  Service: Vascular;  Laterality: Left;   ESOPHAGOGASTRODUODENOSCOPY N/A 01/19/2017   Procedure: ESOPHAGOGASTRODUODENOSCOPY (EGD);  Surgeon: Corbin Ade, MD;  [facility];  Service:  Endoscopy;  Laterality: N/A;   PATCH ANGIOPLASTY Left 04/17/2017   Procedure: PATCH ANGIOPLASTY;  Surgeon: Fransisco Hertz, MD;  Location: [facility] OR;  Service: Vascular;  Laterality: Left;    Social History: Social History   Socioeconomic History   Marital status: Married    Spouse name: Not on file   Number of children: 1   Years of education: Not on file   Highest education level: Not on file  Occupational History   Occupation: retired  Tobacco Use   Smoking status: Former    Current packs/day: 0.00    Types: Cigarettes    Start date: 1950    Quit date: 1980    Years since quitting: 45.1   Smokeless tobacco: Never  Vaping Use   Vaping status: Never Used  Substance and Sexual Activity   Alcohol use: No   Drug use: No   Sexual activity: Never  Other Topics Concern  Not on file  Social History Narrative   Not on file   Social Drivers of Health   Financial Resource Strain: Not on file  Food Insecurity: No Food Insecurity (09/27/2023)   Hunger Vital Sign    Worried About Running Out of Food in the Last Year: Never true    Ran Out of Food in the Last Year: Never true  Transportation Needs: No Transportation Needs (09/27/2023)   PRAPARE - Administrator, Civil Service (Medical): No    Lack of Transportation (Non-Medical): No  Physical Activity: Not on file  Stress: Not on file  Social Connections: Not on file  Intimate Partner Violence: Not At Risk (09/27/2023)   Humiliation, Afraid, Rape, and Kick questionnaire    Fear of Current or Ex-Partner: No    Emotionally Abused: No    Physically Abused: No    Sexually Abused: No    Family History: Family History  Problem Relation Age of Onset   Hypertension Father    Diabetes Brother    Parkinson's disease Brother    Pancreatic cancer Cousin        pat first cousin   Colon cancer Neg Hx    Colon polyps Neg Hx     Current Medications:  Current Outpatient Medications:    abiraterone acetate (ZYTIGA) 250 MG  tablet, Take 2 tablets (500 mg total) by mouth daily. Take on an empty stomach 1 hour before or 2 hours after a meal, Disp: 60 tablet, Rfl: 0   acetaminophen (TYLENOL) 500 MG tablet, Take 1,000 mg by mouth 2 (two) times daily as needed for moderate pain., Disp: , Rfl:    ascorbic acid (VITAMIN C) 500 MG tablet, Take by mouth., Disp: , Rfl:    calcium citrate (CALCITRATE - DOSED IN MG ELEMENTAL CALCIUM) 950 (200 Ca) MG tablet, Take by mouth., Disp: , Rfl:    Docusate Sodium (DSS) 100 MG CAPS, Take by mouth., Disp: , Rfl:    losartan (COZAAR) 50 MG tablet, Take 50 mg by mouth daily., Disp: , Rfl:    meloxicam (MOBIC) 7.5 MG tablet, Take 7.5 mg by mouth daily., Disp: , Rfl:    Multiple Vitamins-Minerals (ICAPS LUTEIN & OMEGA-3) CAPS, Take 1 capsule by mouth daily., Disp: , Rfl:    predniSONE (DELTASONE) 5 MG tablet, Take 1 tablet (5 mg total) by mouth daily with breakfast., Disp: 30 tablet, Rfl: 2   senna-docusate (SENOKOT-S) 8.6-50 MG tablet, Take by mouth., Disp: , Rfl:    traMADol (ULTRAM) 50 MG tablet, Take 50 mg by mouth every 6 (six) hours as needed., Disp: , Rfl:    VITAMIN D PO, Take by mouth., Disp: , Rfl:   Current Facility-Administered Medications:    degarelix (FIRMAGON) injection 240 mg, 240 mg, Subcutaneous, Once,    Allergies: No Known Allergies  REVIEW OF SYSTEMS:   Review of Systems  Constitutional:  Negative for chills, fatigue and fever.  HENT:   Negative for lump/mass, mouth sores, nosebleeds, sore throat and trouble swallowing.   Eyes:  Negative for eye problems.  Respiratory:  Positive for shortness of breath. Negative for cough.   Cardiovascular:  Negative for chest pain, leg swelling and palpitations.  Gastrointestinal:  Negative for abdominal pain, constipation, diarrhea, nausea and vomiting.  Genitourinary:  Negative for bladder incontinence, difficulty urinating, dysuria, frequency, hematuria and nocturia.   Musculoskeletal:  Positive for arthralgias. Negative for  back pain, flank pain, myalgias and neck pain.  Skin:  Negative for itching and  rash.  Neurological:  Negative for dizziness, headaches and numbness.  Hematological:  Does not bruise/bleed easily.  Psychiatric/Behavioral:  Negative for depression, sleep disturbance and suicidal ideas. The patient is not nervous/anxious.   All other systems reviewed and are negative.    VITALS:   Blood pressure (!) 166/77, pulse 76, temperature 97.6 F (36.4 C), temperature source Tympanic, resp. rate 20, weight 141 lb 8.6 oz (64.2 kg), SpO2 96%.  Wt Readings from Last 3 Encounters:  10/16/23 141 lb 8.6 oz (64.2 kg)  09/27/23 137 lb 12.6 oz (62.5 kg)  05/25/23 146 lb 9.6 oz (66.5 kg)    Body mass index is 19.74 kg/m.  Performance status (ECOG): 2 - Symptomatic, <50% confined to bed  PHYSICAL EXAM:   Physical Exam Vitals and nursing note reviewed. Exam conducted with a chaperone present.  Constitutional:      Appearance: Normal appearance.  Cardiovascular:     Rate and Rhythm: Normal rate and regular rhythm.     Pulses: Normal pulses.     Heart sounds: Normal heart sounds.  Pulmonary:     Effort: Pulmonary effort is normal.     Breath sounds: Normal breath sounds.  Abdominal:     Palpations: Abdomen is soft. There is no hepatomegaly, splenomegaly or mass.     Tenderness: There is no abdominal tenderness.  Musculoskeletal:     Right lower leg: No edema.     Left lower leg: No edema.  Lymphadenopathy:     Cervical: No cervical adenopathy.     Right cervical: No superficial, deep or posterior cervical adenopathy.    Left cervical: No superficial, deep or posterior cervical adenopathy.     Upper Body:     Right upper body: No supraclavicular or axillary adenopathy.     Left upper body: No supraclavicular or axillary adenopathy.  Neurological:     General: No focal deficit present.     Mental Status: He is alert and oriented to person, place, and time.  Psychiatric:        Mood and  Affect: Mood normal.        Behavior: Behavior normal.     LABS:   CBC    Component Value Date/Time   WBC 10.8 (H) 09/27/2023 1358   RBC 3.76 (L) 09/27/2023 1359   RBC 3.89 (L) 09/27/2023 1358   HGB 12.2 (L) 09/27/2023 1358   HCT 36.9 (L) 09/27/2023 1358   PLT 285 09/27/2023 1358   MCV 94.9 09/27/2023 1358   MCH 31.4 09/27/2023 1358   MCHC 33.1 09/27/2023 1358   RDW 12.1 09/27/2023 1358   LYMPHSABS 1.7 09/27/2023 1358   MONOABS 1.1 (H) 09/27/2023 1358   EOSABS 0.2 09/27/2023 1358   BASOSABS 0.1 09/27/2023 1358    CMP    Component Value Date/Time   NA 142 09/15/2023 0824   K 4.2 09/15/2023 0824   CL 99 09/15/2023 0824   CO2 23 09/15/2023 0824   GLUCOSE 140 (H) 09/15/2023 0824   GLUCOSE 104 (H) 10/02/2017 0323   BUN 25 09/15/2023 0824   CREATININE 0.79 09/15/2023 0824   CALCIUM 9.7 09/15/2023 0824   PROT 7.2 09/15/2023 0824   ALBUMIN 4.4 09/15/2023 0824   AST 18 09/15/2023 0824   ALT 11 09/15/2023 0824   ALKPHOS 87 09/15/2023 0824   BILITOT 0.4 09/15/2023 0824   GFRNONAA >60 10/02/2017 0323   GFRAA >60 10/02/2017 0323     No results found for: "CEA1", "CEA" / No results found for: "  CEA1", "CEA" Lab Results  Component Value Date   PSA1 87.8 (H) 09/15/2023   No results found for: "CAN199" No results found for: "CAN125"  Lab Results  Component Value Date   TOTALPROTELP 7.3 09/27/2023   ALBUMINELP 3.7 09/27/2023   A1GS 0.3 09/27/2023   A2GS 1.1 (H) 09/27/2023   BETS 1.3 09/27/2023   GAMS 0.8 09/27/2023   MSPIKE Not Observed 09/27/2023   SPEI Comment 09/27/2023   Lab Results  Component Value Date   TIBC 314 09/27/2023   FERRITIN 127 09/27/2023   IRONPCTSAT 14 (L) 09/27/2023   Lab Results  Component Value Date   LDH 137 09/27/2023     STUDIES:   No results found.

## 2023-10-17 LAB — TESTOSTERONE: Testosterone: 52 ng/dL — ABNORMAL LOW (ref 264–916)

## 2023-10-17 MED ORDER — PREDNISONE 5 MG PO TABS: 5.0000 mg | ORAL_TABLET | Freq: Every day | ORAL | 2 refills | Status: DC

## 2023-10-17 MED ORDER — ABIRATERONE ACETATE 250 MG PO TABS
500.0000 mg | ORAL_TABLET | Freq: Every day | ORAL | 0 refills | Status: DC
Start: 2023-10-17 — End: 2023-11-15
  Filled 2023-10-20: qty 60, 30d supply, fill #0

## 2023-10-18 ENCOUNTER — Telehealth: Payer: Self-pay | Admitting: Pharmacy Technician

## 2023-10-18 ENCOUNTER — Other Ambulatory Visit (HOSPITAL_COMMUNITY): Payer: Self-pay

## 2023-10-18 NOTE — Telephone Encounter (Signed)
 Oral Oncology Patient Advocate Encounter   Received notification that prior authorization for Zytiga is required.   PA submitted on 10/18/2023 Key [id number] Status is pending     Patty Almedia Balls, CPhT Oncology Pharmacy Patient Advocate [facility] Direct Number: [phone] Fax: [phone]

## 2023-10-19 ENCOUNTER — Other Ambulatory Visit: Payer: Self-pay | Admitting: Pharmacy Technician

## 2023-10-19 ENCOUNTER — Other Ambulatory Visit (HOSPITAL_COMMUNITY): Payer: Self-pay

## 2023-10-19 ENCOUNTER — Telehealth: Payer: Self-pay | Admitting: Pharmacist

## 2023-10-19 DIAGNOSIS — I6522 Occlusion and stenosis of left carotid artery: Secondary | ICD-10-CM | POA: Diagnosis not present

## 2023-10-19 DIAGNOSIS — Z87891 Personal history of nicotine dependence: Secondary | ICD-10-CM | POA: Diagnosis not present

## 2023-10-19 DIAGNOSIS — M1990 Unspecified osteoarthritis, unspecified site: Secondary | ICD-10-CM | POA: Diagnosis not present

## 2023-10-19 DIAGNOSIS — C61 Malignant neoplasm of prostate: Secondary | ICD-10-CM | POA: Diagnosis not present

## 2023-10-19 DIAGNOSIS — I2510 Atherosclerotic heart disease of native coronary artery without angina pectoris: Secondary | ICD-10-CM | POA: Diagnosis not present

## 2023-10-19 DIAGNOSIS — K5900 Constipation, unspecified: Secondary | ICD-10-CM | POA: Diagnosis not present

## 2023-10-19 DIAGNOSIS — D649 Anemia, unspecified: Secondary | ICD-10-CM | POA: Diagnosis not present

## 2023-10-19 DIAGNOSIS — N401 Enlarged prostate with lower urinary tract symptoms: Secondary | ICD-10-CM | POA: Diagnosis not present

## 2023-10-19 DIAGNOSIS — Z791 Long term (current) use of non-steroidal anti-inflammatories (NSAID): Secondary | ICD-10-CM | POA: Diagnosis not present

## 2023-10-19 DIAGNOSIS — I10 Essential (primary) hypertension: Secondary | ICD-10-CM | POA: Diagnosis not present

## 2023-10-19 NOTE — Telephone Encounter (Signed)
 Oral Oncology Patient Advocate Encounter  Prior Authorization for Roosvelt Maser has been approved.    PA# [id number] Effective dates: 10/18/2023 through 10/17/2024  Patients co-pay is $100.    Patty Almedia Balls, CPhT Oncology Pharmacy Patient Advocate [facility] Direct Number: [phone] Fax: [phone]

## 2023-10-19 NOTE — Telephone Encounter (Signed)
 Oral Oncology Patient Advocate Encounter  Called patient to talk about co-pay and if he would okay with the price.   I was able to speak with him and his daughter.   Since Roosvelt Maser has a cash price patients can use when filling at [facility], patient has decided to go with that amount which would be ~$70.   Patty Almedia Balls, CPhT Oncology Pharmacy Patient Advocate [facility] Direct Number: [phone] Fax: [phone]

## 2023-10-19 NOTE — Progress Notes (Signed)
 Specialty Pharmacy Initial Fill Coordination Note  Andrew [name] is a 89 y.o. male contacted today regarding refills of specialty medication(s) Abiraterone Acetate (ZYTIGA) .  Patient requested Delivery  on 10/24/23  to verified address [address] [place] [address]   Medication will be filled on 10/23/2023.   Patient is aware of $70 copayment.    Patty Almedia Balls, CPhT Oncology Pharmacy Patient Advocate [facility] Direct Number: [phone] Fax: [phone]

## 2023-10-19 NOTE — Telephone Encounter (Signed)
 Clinical Pharmacist Practitioner Encounter   Received new prescription for Zytiga (abiraterone) for the treatment of castration resistant prostate cancer in conjunction with prednisone, planned duration until disease progression or unacceptable drug toxicity.  CMP from 09/15/23 assessed, no relevant lab abnormalities. Prescription dose and frequency assessed.   Current medication list in Epic reviewed, one DDIs with abiraterone identified: Tramadol: Abiraterone may decrease serum concentrations of the active metabolite(s) of tramadol. Monitor for decreased therapeutic response. No baseline dose adjustment needed.   Evaluated chart and no patient barriers to medication adherence identified.   Prescription has been e-scribed to the [facility] for benefits analysis and approval.  Oral Oncology Clinic will continue to follow for insurance authorization, copayment issues, initial counseling and start date.   Remi Haggard, PharmD, BCOP, CPP Hematology/Oncology Clinical Pharmacist [facility] Oral Chemotherapy Navigation Clinic [phone]  10/19/2023 9:39 AM

## 2023-10-19 NOTE — Telephone Encounter (Signed)
 Clinical Pharmacist Practitioner Encounter   [facility] (Specialty) will deliver medication to patient on 10/24/23. He will start when he has medication in hand.   Patient Education I spoke with patient and his daughter Rinda  for overview of new oral chemotherapy medication:Zytiga (abiraterone) for the treatment of castration resistant prostate cancer in conjunction with prednisone, planned duration until disease progression or unacceptable drug toxicity.   Counseled patient on administration, dosing, side effects, monitoring, drug-food interactions, safe handling, storage, and disposal. Patient will take 2 tablets (500 mg total) by mouth daily. Take on an empty stomach 1 hour before or 2 hours after a meal.  Side effects include but not limited to: fatigue, hypertension, edema, and decreased wbc.    Reviewed with patient importance of keeping a medication schedule and plan for any missed doses.  After discussion with patient no patient barriers to medication adherence identified.   The [facility] voiced understanding and appreciation. All questions answered. Medication handout provided.  Provided patient with Oral Chemotherapy Navigation Clinic phone number. Patient knows to call the office with questions or concerns. Oral Chemotherapy Navigation Clinic will continue to follow.  Remi Haggard, PharmD, BCOP, CPP Hematology/Oncology Clinical Pharmacist [facility] Oral Chemotherapy Navigation [facility] [phone]  10/19/2023 10:17 AM

## 2023-10-19 NOTE — Progress Notes (Signed)
 Patient education documented in EPIC note on 10/19/23.

## 2023-10-20 ENCOUNTER — Other Ambulatory Visit: Payer: Self-pay

## 2023-10-20 ENCOUNTER — Other Ambulatory Visit (HOSPITAL_COMMUNITY): Payer: Self-pay

## 2023-10-23 ENCOUNTER — Other Ambulatory Visit: Payer: Self-pay

## 2023-10-25 ENCOUNTER — Ambulatory Visit (INDEPENDENT_AMBULATORY_CARE_PROVIDER_SITE_OTHER): Payer: PPO

## 2023-10-25 DIAGNOSIS — C61 Malignant neoplasm of prostate: Secondary | ICD-10-CM

## 2023-10-25 MED ORDER — DEGARELIX ACETATE 80 MG ~~LOC~~ SOLR
80.0000 mg | Freq: Once | SUBCUTANEOUS | Status: AC
Start: 2023-10-25 — End: 2023-10-25
  Administered 2023-10-25: 80 mg via SUBCUTANEOUS

## 2023-10-25 NOTE — Progress Notes (Signed)
 Firmagon Sub Q Injection  Due to Prostate Cancer patient is present today for a Firmagon Injection.  Order received and reviewed and authorization verification reviewed.   Medication: Deborra Medina (Degarelix)  Dose: 80mg  Location: right upper abdomen cleaned and prepped with alcohol prior to injection Lot: [id number] Exp: 06/22/2025  Patient tolerated well, no complications were noted Band aid applied over injection site.   Performed by: Alfonse Spruce. CMA  Follow up: 1 month for injection. Appointment scheduled with patient.

## 2023-11-07 ENCOUNTER — Other Ambulatory Visit: Payer: Self-pay

## 2023-11-07 DIAGNOSIS — C61 Malignant neoplasm of prostate: Secondary | ICD-10-CM

## 2023-11-07 DIAGNOSIS — Z800 Family history of malignant neoplasm of digestive organs: Secondary | ICD-10-CM

## 2023-11-08 ENCOUNTER — Inpatient Hospital Stay: Payer: PPO | Admitting: Hematology

## 2023-11-08 ENCOUNTER — Inpatient Hospital Stay: Payer: PPO | Attending: Hematology

## 2023-11-08 VITALS — BP 145/65 | HR 77 | Temp 98.1°F | Resp 18 | Wt 140.7 lb

## 2023-11-08 DIAGNOSIS — Z79890 Hormone replacement therapy (postmenopausal): Secondary | ICD-10-CM | POA: Insufficient documentation

## 2023-11-08 DIAGNOSIS — Z79899 Other long term (current) drug therapy: Secondary | ICD-10-CM | POA: Insufficient documentation

## 2023-11-08 DIAGNOSIS — Z87891 Personal history of nicotine dependence: Secondary | ICD-10-CM | POA: Insufficient documentation

## 2023-11-08 DIAGNOSIS — C61 Malignant neoplasm of prostate: Secondary | ICD-10-CM

## 2023-11-08 DIAGNOSIS — Z800 Family history of malignant neoplasm of digestive organs: Secondary | ICD-10-CM

## 2023-11-08 DIAGNOSIS — R5383 Other fatigue: Secondary | ICD-10-CM | POA: Diagnosis not present

## 2023-11-08 DIAGNOSIS — R972 Elevated prostate specific antigen [PSA]: Secondary | ICD-10-CM | POA: Diagnosis not present

## 2023-11-08 DIAGNOSIS — Z7952 Long term (current) use of systemic steroids: Secondary | ICD-10-CM | POA: Diagnosis not present

## 2023-11-08 DIAGNOSIS — Z8546 Personal history of malignant neoplasm of prostate: Secondary | ICD-10-CM | POA: Diagnosis not present

## 2023-11-08 LAB — CBC WITH DIFFERENTIAL/PLATELET
Abs Immature Granulocytes: 0.04 10*3/uL (ref 0.00–0.07)
Basophils Absolute: 0 10*3/uL (ref 0.0–0.1)
Basophils Relative: 0 %
Eosinophils Absolute: 0.1 10*3/uL (ref 0.0–0.5)
Eosinophils Relative: 1 %
HCT: 34.6 % — ABNORMAL LOW (ref 39.0–52.0)
Hemoglobin: 11.3 g/dL — ABNORMAL LOW (ref 13.0–17.0)
Immature Granulocytes: 0 %
Lymphocytes Relative: 11 %
Lymphs Abs: 1.1 10*3/uL (ref 0.7–4.0)
MCH: 32.2 pg (ref 26.0–34.0)
MCHC: 32.7 g/dL (ref 30.0–36.0)
MCV: 98.6 fL (ref 80.0–100.0)
Monocytes Absolute: 0.6 10*3/uL (ref 0.1–1.0)
Monocytes Relative: 6 %
Neutro Abs: 8.2 10*3/uL — ABNORMAL HIGH (ref 1.7–7.7)
Neutrophils Relative %: 82 %
Platelets: 269 10*3/uL (ref 150–400)
RBC: 3.51 MIL/uL — ABNORMAL LOW (ref 4.22–5.81)
RDW: 12.5 % (ref 11.5–15.5)
WBC: 10.1 10*3/uL (ref 4.0–10.5)
nRBC: 0 % (ref 0.0–0.2)

## 2023-11-08 LAB — COMPREHENSIVE METABOLIC PANEL
ALT: 13 U/L (ref 0–44)
AST: 15 U/L (ref 15–41)
Albumin: 3.6 g/dL (ref 3.5–5.0)
Alkaline Phosphatase: 66 U/L (ref 38–126)
Anion gap: 9 (ref 5–15)
BUN: 25 mg/dL — ABNORMAL HIGH (ref 8–23)
CO2: 26 mmol/L (ref 22–32)
Calcium: 9 mg/dL (ref 8.9–10.3)
Chloride: 100 mmol/L (ref 98–111)
Creatinine, Ser: 0.71 mg/dL (ref 0.61–1.24)
GFR, Estimated: >60 mL/min (ref >60)
Glucose, Bld: 146 mg/dL — ABNORMAL HIGH (ref 70–99)
Potassium: 4 mmol/L (ref 3.5–5.1)
Sodium: 135 mmol/L (ref 135–145)
Total Bilirubin: 0.5 mg/dL (ref 0.0–1.2)
Total Protein: 6.8 g/dL (ref 6.5–8.1)

## 2023-11-08 LAB — MAGNESIUM: Magnesium: 2.2 mg/dL (ref 1.7–2.4)

## 2023-11-08 LAB — PSA: Prostatic Specific Antigen: 15.45 ng/mL — ABNORMAL HIGH (ref 0.00–4.00)

## 2023-11-08 NOTE — Patient Instructions (Addendum)
 [facility] Discharge Instructions   You were seen and examined today by Dr. Ellin Saba.  He reviewed the results of your lab work which are normal/stable.   We will see you back in 3 weeks. We will repeat lab work at that time.  Return as scheduled.    Thank you for choosing [facility] at [facility] to provide your oncology and hematology care.  To afford each patient quality time with our provider, please arrive at least 15 minutes before your scheduled appointment time.   If you have a lab appointment with the Cancer Center please come in thru the Main Entrance and check in at the main information desk.  You need to re-schedule your appointment should you arrive 10 or more minutes late.  We strive to give you quality time with our providers, and arriving late affects you and other patients whose appointments are after yours.  Also, if you no show three or more times for appointments you may be dismissed from the clinic at the providers discretion.     Again, thank you for choosing [facility].  Our hope is that these requests will decrease the amount of time that you wait before being seen by our physicians.       _____________________________________________________________  Should you have questions after your visit to [facility], please contact our office at [phone] and follow the prompts.  Our office hours are 8:00 a.m. and 4:30 p.m. Monday - Friday.  Please note that voicemails left after 4:00 p.m. may not be returned until the following business day.  We are closed weekends and major holidays.  You do have access to a nurse 24-7, just call the main number to the clinic [phone] and do not press any options, hold on the line and a nurse will answer the phone.    For prescription refill requests, have your pharmacy contact our office and allow 72 hours.    Due to Covid, you will need to  wear a mask upon entering the hospital. If you do not have a mask, a mask will be given to you at the Main Entrance upon arrival. For doctor visits, patients may have 1 support person age 88 or older with them. For treatment visits, patients can not have anyone with them due to social distancing guidelines and our immunocompromised population.

## 2023-11-08 NOTE — Progress Notes (Signed)
 Patient is taking Zytiga as prescribed. He has not missed any doses and reports no side effects at this time.

## 2023-11-08 NOTE — Progress Notes (Signed)
 [facility] [address], [place] [address]   Clinic Day:  11/08/2023  Referring physician: Estanislado Pandy, MD  Patient Care Team: Estanislado Pandy, MD as PCP - General (Family Medicine) Laqueta Linden, MD (Inactive) as PCP - Cardiology (Cardiology) Jena Gauss Gerrit Friends, MD as Consulting Physician (Gastroenterology) Doreatha Massed, MD as Medical Oncologist (Medical Oncology) Therese Sarah, RN as Oncology Nurse Navigator (Medical Oncology)   ASSESSMENT & PLAN:   Assessment:  1.  [name]: - Patient seen at the request of Dr. Annabell Howells - Clinical diagnosis of prostate cancer was made based on PET scan from 01/20/2022 that showed prostate only uptake and no metastasis.  PSA 39.8. - PSMA PET (06/30/2022): Marked activity in the paramedian left lobe of the prostate.  No evidence of metastatic disease. - Bone scan (06/01/2023): Increased activity in the sternomanubrial region, bilateral shoulders, elbows, wrists and knees probably degenerative.  No definite bone metastasis. - Firmagon started on 05/31/2023 for elevated PSA of 145 (05/17/2023) - PSA improved to 111 (06/23/2023), and 87.8 (09/15/2023), testosterone 59  2.  Social/family history: - Lives at home with his wife.  Daughter stays with him and helps him with her day-to-day activities.  He uses walker to ambulate due to arthritis and decreased strength.  He is mostly sitting in a chair during daytime watching TV and gets up every 2-3 hours.  He cannot stand more than 3 minutes at a time.  He needs help with day-to-day activities including wearing his pants.  He fell about 2-3 times in the last 5 years.  He quit smoking in 1980.  No family history of malignancy that he could remember.  Plan:  1.  Castrate resistant prostate cancer: - He started on abiraterone 500 mg daily with prednisone 5 mg daily on 10/24/2023.  Last Firmagon 80 mg was on 10/25/2023. - He reported slight worsening of  weakness about 3 days after starting abiraterone.  He reports that some days are better than other days. - Blood pressure today is 1 4465.  Potassium is normal at 4.0.  LFTs are normal.  PSA was 93.58 on 10/16/2023.  CBC today is grossly normal. - We discussed option of stopping Zytiga and enrolling in palliative/hospice.  He would like to continue Zytiga at this time.  Continue at same dose and return to clinic in 2 weeks with repeat LFTs and potassium levels. - Daughter reports that they are planning to move to [place] (close to [place]) area.  Will make a referral to medical oncology locally.  2.  Severe fatigue: - He has underlying fatigue mostly from [place].  This has slightly gotten worse since Zytiga was started.   Orders Placed This Encounter  Procedures   CBC with Differential    Standing Status:   Future    Expected Date:   11/29/2023    Expiration Date:   11/07/2024   Comprehensive metabolic panel    Standing Status:   Future    Expected Date:   11/29/2023    Expiration Date:   11/07/2024   Magnesium    Standing Status:   Future    Expected Date:   11/29/2023    Expiration Date:   11/07/2024     Mikeal Hawthorne R Teague,acting as a scribe for Doreatha Massed, MD.,have documented all relevant documentation on the behalf of Doreatha Massed, MD,as directed by  Doreatha Massed, MD while in the presence of Doreatha Massed, MD.  I, Doreatha Massed  MD, have reviewed the above documentation for accuracy and completeness, and I agree with the above.     Doreatha Massed, MD   3/19/20254:10 PM  CHIEF COMPLAINT/PURPOSE OF CONSULT:   Diagnosis: Prostate Cancer   Cancer Staging  [name], AJCC 8th Edition - Clinical stage from 09/27/2023: cT2a, [name], [name], [name] - Unsigned    Prior Therapy: None  Current Therapy:  Firmagon 80 mg monthly   HISTORY OF PRESENT ILLNESS:   Oncology History   No history exists.      Andrew [name]  is a 89 y.o. male presenting to clinic today for evaluation of prostate cancer at the request of Dr. Annabell Howells.  Patient was diagnosed with prostate cancer on 01/06/2022 from persistent elevated PSA, which was 35 at that time. He has been receiving Firmagon injections monthly since 05/17/23. His last PSA was down to 87.8 from 145 (prior to starting therapy). His last testosterone was at 59. This is in the setting of persistent LUTS. He was referred to me for possible second line agents. PET PSMA from 2023 did not show metastatic disease.   He underwent a NM whole body scan on 06/01/23 that found: Multifocal activity which is probably degenerative. No definite osseous metastatic disease.  Today, he states that he is doing well overall. His appetite level is at 75%. His energy level is at 10%.  PAST MEDICAL HISTORY:   Andrew [name] is a 89 y.o. male presenting to the clinic today for follow-up of prostate cancer. He was last seen by me on 10/16/23.  Today, he states that he is doing well overall. His appetite level is at 80%. His energy level is at 10%.   PAST MEDICAL HISTORY:   Past Medical History: Past Medical History:  Diagnosis Date   Arthritis    BPH (benign prostatic hyperplasia)    Carotid artery stenosis    a. s/p L CEA in 03/2017   Family history of pancreatic cancer    Hypertension     Surgical History: Past Surgical History:  Procedure Laterality Date   CAROTID ENDARTERECTOMY Left 04/17/2017   COLONOSCOPY N/A 01/19/2017   Procedure: COLONOSCOPY;  Surgeon: Corbin Ade, MD;  [facility];  Service: Endoscopy;  Laterality: N/A;  1:15pm   ENDARTERECTOMY Left 04/17/2017   Procedure: ENDARTERECTOMY CAROTID - LEFT;  Surgeon: Fransisco Hertz, MD;  Location: [facility] OR;  Service: Vascular;  Laterality: Left;   ESOPHAGOGASTRODUODENOSCOPY N/A 01/19/2017   Procedure: ESOPHAGOGASTRODUODENOSCOPY (EGD);  Surgeon: Corbin Ade, MD;  Location: [facility];  Service: Endoscopy;   Laterality: N/A;   PATCH ANGIOPLASTY Left 04/17/2017   Procedure: PATCH ANGIOPLASTY;  Surgeon: Fransisco Hertz, MD;  Location: [facility] OR;  Service: Vascular;  Laterality: Left;    Social History: Social History   Socioeconomic History   Marital status: Married    Spouse name: Not on file   Number of children: 1   Years of education: Not on file   Highest education level: Not on file  Occupational History   Occupation: retired  Tobacco Use   Smoking status: Former    Current packs/day: 0.00    Types: Cigarettes    Start date: 1950    Quit date: 1980    Years since quitting: 45.2   Smokeless tobacco: Never  Vaping Use   Vaping status: Never Used  Substance and Sexual Activity   Alcohol use: No   Drug use: No   Sexual activity: Never  Other Topics Concern   Not on file  Social History Narrative   Not on file   Social Drivers of Health   Financial Resource Strain: Not on file  Food Insecurity: No Food Insecurity (09/27/2023)   Hunger Vital Sign    Worried About Running Out of Food in the Last Year: Never true    Ran Out of Food in the Last Year: Never true  Transportation Needs: No Transportation Needs (09/27/2023)   PRAPARE - Administrator, Civil Service (Medical): No    Lack of Transportation (Non-Medical): No  Physical Activity: Not on file  Stress: Not on file  Social Connections: Not on file  Intimate Partner Violence: Not At Risk (09/27/2023)   Humiliation, Afraid, Rape, and Kick questionnaire    Fear of Current or Ex-Partner: No    Emotionally Abused: No    Physically Abused: No    Sexually Abused: No    Family History: Family History  Problem Relation Age of Onset   Hypertension Father    Diabetes Brother    Parkinson's disease Brother    Pancreatic cancer Cousin        pat first cousin   Andrew cancer Neg Hx    Andrew polyps Neg Hx     Current Medications:  Current Outpatient Medications:    abiraterone acetate (ZYTIGA) 250 MG tablet, Take 2  tablets (500 mg total) by mouth daily. Take on an empty stomach 1 hour before or 2 hours after a meal, Disp: 60 tablet, Rfl: 0   acetaminophen (TYLENOL) 500 MG tablet, Take 1,000 mg by mouth 2 (two) times daily as needed for moderate pain., Disp: , Rfl:    Docusate Sodium (DSS) 100 MG CAPS, Take by mouth., Disp: , Rfl:    losartan (COZAAR) 50 MG tablet, Take 50 mg by mouth daily., Disp: , Rfl:    meloxicam (MOBIC) 7.5 MG tablet, Take 7.5 mg by mouth daily., Disp: , Rfl:    Multiple Vitamins-Minerals (ICAPS LUTEIN & OMEGA-3) CAPS, Take 1 capsule by mouth daily., Disp: , Rfl:    predniSONE (DELTASONE) 5 MG tablet, Take 1 tablet (5 mg total) by mouth daily with breakfast., Disp: 30 tablet, Rfl: 2   senna-docusate (SENOKOT-S) 8.6-50 MG tablet, Take by mouth., Disp: , Rfl:    traMADol (ULTRAM) 50 MG tablet, Take 50 mg by mouth every 6 (six) hours as needed., Disp: , Rfl:    VITAMIN D PO, Take by mouth., Disp: , Rfl:   Current Facility-Administered Medications:    degarelix (FIRMAGON) injection 240 mg, 240 mg, Subcutaneous, Once,    Allergies: No Known Allergies  REVIEW OF SYSTEMS:   Review of Systems  Constitutional:  Positive for fatigue. Negative for chills and fever.  HENT:   Negative for lump/mass, mouth sores, nosebleeds, sore throat and trouble swallowing.   Eyes:  Negative for eye problems.  Respiratory:  Negative for cough and shortness of breath (On exertion).   Cardiovascular:  Negative for chest pain, leg swelling and palpitations.  Gastrointestinal:  Negative for abdominal pain, constipation, diarrhea, nausea and vomiting.  Genitourinary:  Negative for bladder incontinence, difficulty urinating, dysuria, frequency, hematuria and nocturia.   Musculoskeletal:  Positive for back pain. Negative for arthralgias (Left knee), flank pain, myalgias and neck pain.  Skin:  Negative for itching and rash.  Neurological:  Negative for dizziness, headaches and numbness.  Hematological:  Does  not bruise/bleed easily.  Psychiatric/Behavioral:  Negative for depression, sleep disturbance and suicidal ideas. The  patient is not nervous/anxious.   All other systems reviewed and are negative.    VITALS:   Blood pressure (!) 145/65, pulse 77, temperature 98.1 F (36.7 C), temperature source Tympanic, resp. rate 18, weight 140 lb 10.5 oz (63.8 kg), SpO2 99%.  Wt Readings from Last 3 Encounters:  11/08/23 140 lb 10.5 oz (63.8 kg)  10/16/23 141 lb 8.6 oz (64.2 kg)  09/27/23 137 lb 12.6 oz (62.5 kg)    Body mass index is 19.62 kg/m.  Performance status (ECOG): 2 - Symptomatic, <50% confined to bed  PHYSICAL EXAM:   Physical Exam Vitals and nursing note reviewed. Exam conducted with a chaperone present.  Constitutional:      Appearance: Normal appearance.  Cardiovascular:     Rate and Rhythm: Normal rate and regular rhythm.     Pulses: Normal pulses.     Heart sounds: Normal heart sounds.  Pulmonary:     Effort: Pulmonary effort is normal.     Breath sounds: Normal breath sounds.  Abdominal:     Palpations: Abdomen is soft. There is no hepatomegaly, splenomegaly or mass.     Tenderness: There is no abdominal tenderness.  Musculoskeletal:     Right lower leg: No edema.     Left lower leg: No edema.  Lymphadenopathy:     Cervical: No cervical adenopathy.     Right cervical: No superficial, deep or posterior cervical adenopathy.    Left cervical: No superficial, deep or posterior cervical adenopathy.     Upper Body:     Right upper body: No supraclavicular or axillary adenopathy.     Left upper body: No supraclavicular or axillary adenopathy.  Neurological:     General: No focal deficit present.     Mental Status: He is alert and oriented to person, place, and time.  Psychiatric:        Mood and Affect: Mood normal.        Behavior: Behavior normal.     LABS:   CBC    Component Value Date/Time   WBC 10.1 11/08/2023 1314   RBC 3.51 (L) 11/08/2023 1314   HGB  11.3 (L) 11/08/2023 1314   HCT 34.6 (L) 11/08/2023 1314   PLT 269 11/08/2023 1314   MCV 98.6 11/08/2023 1314   MCH 32.2 11/08/2023 1314   MCHC 32.7 11/08/2023 1314   RDW 12.5 11/08/2023 1314   LYMPHSABS 1.1 11/08/2023 1314   MONOABS 0.6 11/08/2023 1314   EOSABS 0.1 11/08/2023 1314   BASOSABS 0.0 11/08/2023 1314    CMP    Component Value Date/Time   NA 135 11/08/2023 1314   NA 142 09/15/2023 0824   K 4.0 11/08/2023 1314   CL 100 11/08/2023 1314   CO2 26 11/08/2023 1314   GLUCOSE 146 (H) 11/08/2023 1314   BUN 25 (H) 11/08/2023 1314   BUN 25 09/15/2023 0824   CREATININE 0.71 11/08/2023 1314   CALCIUM 9.0 11/08/2023 1314   PROT 6.8 11/08/2023 1314   PROT 7.2 09/15/2023 0824   ALBUMIN 3.6 11/08/2023 1314   ALBUMIN 4.4 09/15/2023 0824   AST 15 11/08/2023 1314   ALT 13 11/08/2023 1314   ALKPHOS 66 11/08/2023 1314   BILITOT 0.5 11/08/2023 1314   BILITOT 0.4 09/15/2023 0824   GFRNONAA >60 11/08/2023 1314   GFRAA >60 10/02/2017 0323     No results found for: "CEA1", "CEA" / No results found for: "CEA1", "CEA" Lab Results  Component Value Date   PSA1 87.8 (H) 09/15/2023  No results found for: "CAN199" No results found for: "CAN125"  Lab Results  Component Value Date   TOTALPROTELP 7.3 09/27/2023   ALBUMINELP 3.7 09/27/2023   A1GS 0.3 09/27/2023   A2GS 1.1 (H) 09/27/2023   BETS 1.3 09/27/2023   GAMS 0.8 09/27/2023   MSPIKE Not Observed 09/27/2023   SPEI Comment 09/27/2023   Lab Results  Component Value Date   TIBC 314 09/27/2023   FERRITIN 127 09/27/2023   IRONPCTSAT 14 (L) 09/27/2023   Lab Results  Component Value Date   LDH 137 09/27/2023     STUDIES:   No results found.

## 2023-11-09 ENCOUNTER — Other Ambulatory Visit: Payer: Self-pay

## 2023-11-13 ENCOUNTER — Other Ambulatory Visit: Payer: Self-pay

## 2023-11-15 ENCOUNTER — Other Ambulatory Visit: Payer: Self-pay

## 2023-11-15 ENCOUNTER — Other Ambulatory Visit: Payer: Self-pay | Admitting: Hematology

## 2023-11-15 DIAGNOSIS — C61 Malignant neoplasm of prostate: Secondary | ICD-10-CM

## 2023-11-15 NOTE — Progress Notes (Signed)
 Specialty Pharmacy Ongoing Clinical Assessment Note  Andrew [name] is a 89 y.o. male who is being followed by the specialty pharmacy service for RxSp Oncology   Patient's specialty medication(s) reviewed today: Abiraterone Acetate (ZYTIGA)   Missed doses in the last 4 weeks: 0   Patient/Caregiver did not have any additional questions or concerns.   Therapeutic benefit summary: Patient is achieving benefit   Adverse events/side effects summary: No adverse events/side effects   Patient's therapy is appropriate to: Continue    Goals Addressed             This Visit's Progress    Slow Disease Progression       Patient is unable to be assessed as therapy was recently initiated. Patient will maintain adherence          Follow up:  3 months  Otto Herb Specialty Pharmacist

## 2023-11-15 NOTE — Progress Notes (Signed)
 Specialty Pharmacy Refill Coordination Note  Andrew [name] is a 89 y.o. male contacted today regarding refills of specialty medication(s) Abiraterone Acetate [name]   Patient requested Delivery   Delivery date: 11/20/23   Verified address: [address] [place] [address]   Medication will be filled on 11/17/23.

## 2023-11-16 ENCOUNTER — Other Ambulatory Visit (HOSPITAL_COMMUNITY): Payer: Self-pay

## 2023-11-16 ENCOUNTER — Other Ambulatory Visit: Payer: Self-pay

## 2023-11-16 ENCOUNTER — Other Ambulatory Visit: Payer: Self-pay | Admitting: Hematology

## 2023-11-16 DIAGNOSIS — C61 Malignant neoplasm of prostate: Secondary | ICD-10-CM

## 2023-11-16 MED ORDER — ABIRATERONE ACETATE 250 MG PO TABS
500.0000 mg | ORAL_TABLET | Freq: Every day | ORAL | 0 refills | Status: DC
  Filled 2023-11-16: qty 60, 30d supply, fill #0

## 2023-11-17 ENCOUNTER — Other Ambulatory Visit: Payer: Self-pay

## 2023-11-21 ENCOUNTER — Inpatient Hospital Stay: Payer: PPO | Attending: Hematology

## 2023-11-22 ENCOUNTER — Ambulatory Visit (INDEPENDENT_AMBULATORY_CARE_PROVIDER_SITE_OTHER)

## 2023-11-22 DIAGNOSIS — C61 Malignant neoplasm of prostate: Secondary | ICD-10-CM | POA: Diagnosis not present

## 2023-11-22 MED ORDER — DEGARELIX ACETATE 80 MG ~~LOC~~ SOLR
80.0000 mg | SUBCUTANEOUS | Status: AC
  Administered 2023-11-27: 80 mg via SUBCUTANEOUS

## 2023-11-22 NOTE — Progress Notes (Signed)
 Firmagon Sub Q Injection  Due to Prostate Cancer patient is present today for a Firmagon Injection.  Order received and reviewed and authorization verification reviewed.   Medication: Deborra Medina (Degarelix)  Dose: 80mg  Location: left upper abdomen cleaned and prepped with alcohol prior to injection Lot: [id number] Exp: 07/22/2025  Patient tolerated well, no complications were noted Band aid applied over injection site.   Performed by: Alfonse Spruce. CMA  Follow up: 1 month for injection. Appointment scheduled with patient.

## 2023-11-27 DIAGNOSIS — C61 Malignant neoplasm of prostate: Secondary | ICD-10-CM | POA: Diagnosis not present

## 2023-11-28 ENCOUNTER — Inpatient Hospital Stay: Payer: PPO | Admitting: Hematology

## 2023-11-30 ENCOUNTER — Inpatient Hospital Stay: Attending: Hematology | Admitting: Hematology

## 2023-11-30 ENCOUNTER — Inpatient Hospital Stay

## 2023-11-30 VITALS — BP 152/71 | HR 75 | Temp 98.4°F | Resp 19

## 2023-11-30 DIAGNOSIS — Z7952 Long term (current) use of systemic steroids: Secondary | ICD-10-CM | POA: Diagnosis not present

## 2023-11-30 DIAGNOSIS — C61 Malignant neoplasm of prostate: Secondary | ICD-10-CM | POA: Insufficient documentation

## 2023-11-30 DIAGNOSIS — Z791 Long term (current) use of non-steroidal anti-inflammatories (NSAID): Secondary | ICD-10-CM | POA: Diagnosis not present

## 2023-11-30 DIAGNOSIS — D72829 Elevated white blood cell count, unspecified: Secondary | ICD-10-CM | POA: Diagnosis not present

## 2023-11-30 DIAGNOSIS — Z87891 Personal history of nicotine dependence: Secondary | ICD-10-CM | POA: Insufficient documentation

## 2023-11-30 DIAGNOSIS — I10 Essential (primary) hypertension: Secondary | ICD-10-CM | POA: Diagnosis not present

## 2023-11-30 DIAGNOSIS — Z79899 Other long term (current) drug therapy: Secondary | ICD-10-CM | POA: Diagnosis not present

## 2023-11-30 LAB — CBC WITH DIFFERENTIAL/PLATELET
Abs Immature Granulocytes: 0.06 10*3/uL (ref 0.00–0.07)
Basophils Absolute: 0 10*3/uL (ref 0.0–0.1)
Basophils Relative: 0 %
Eosinophils Absolute: 0.1 10*3/uL (ref 0.0–0.5)
Eosinophils Relative: 1 %
HCT: 34.1 % — ABNORMAL LOW (ref 39.0–52.0)
Hemoglobin: 11.5 g/dL — ABNORMAL LOW (ref 13.0–17.0)
Immature Granulocytes: 1 %
Lymphocytes Relative: 10 %
Lymphs Abs: 1.2 10*3/uL (ref 0.7–4.0)
MCH: 32.6 pg (ref 26.0–34.0)
MCHC: 33.7 g/dL (ref 30.0–36.0)
MCV: 96.6 fL (ref 80.0–100.0)
Monocytes Absolute: 0.6 10*3/uL (ref 0.1–1.0)
Monocytes Relative: 5 %
Neutro Abs: 10 10*3/uL — ABNORMAL HIGH (ref 1.7–7.7)
Neutrophils Relative %: 83 %
Platelets: 266 10*3/uL (ref 150–400)
RBC: 3.53 MIL/uL — ABNORMAL LOW (ref 4.22–5.81)
RDW: 12.4 % (ref 11.5–15.5)
WBC: 11.9 10*3/uL — ABNORMAL HIGH (ref 4.0–10.5)
nRBC: 0 % (ref 0.0–0.2)

## 2023-11-30 LAB — COMPREHENSIVE METABOLIC PANEL WITH GFR
ALT: 12 U/L (ref 0–44)
AST: 17 U/L (ref 15–41)
Albumin: 3.6 g/dL (ref 3.5–5.0)
Alkaline Phosphatase: 70 U/L (ref 38–126)
Anion gap: 9 (ref 5–15)
BUN: 21 mg/dL (ref 8–23)
CO2: 26 mmol/L (ref 22–32)
Calcium: 9.3 mg/dL (ref 8.9–10.3)
Chloride: 101 mmol/L (ref 98–111)
Creatinine, Ser: 0.67 mg/dL (ref 0.61–1.24)
GFR, Estimated: >60 mL/min (ref >60)
Glucose, Bld: 126 mg/dL — ABNORMAL HIGH (ref 70–99)
Potassium: 3.6 mmol/L (ref 3.5–5.1)
Sodium: 136 mmol/L (ref 135–145)
Total Bilirubin: 0.6 mg/dL (ref 0.0–1.2)
Total Protein: 6.6 g/dL (ref 6.5–8.1)

## 2023-11-30 LAB — MAGNESIUM: Magnesium: 2 mg/dL (ref 1.7–2.4)

## 2023-11-30 NOTE — Progress Notes (Signed)
 [facility] [address], [place] [address]   Clinic Day:  11/30/2023  Referring physician: Estanislado Pandy, MD  Patient Care Team: Andrew Pandy, MD as PCP - General (Family Medicine) Andrew Linden, MD (Inactive) as PCP - Cardiology (Cardiology) Andrew Gauss Gerrit Friends, MD as Consulting Physician (Gastroenterology) Andrew Massed, MD as Medical Oncologist (Medical Oncology) Andrew Sarah, RN as Oncology Nurse Navigator (Medical Oncology)   ASSESSMENT & PLAN:   Assessment:  1.  Andrew [name]: - Patient seen at the request of Dr. Annabell [name] - Clinical diagnosis of prostate [name] was made based on PET scan from 01/20/2022 that showed prostate only uptake and no metastasis.  PSA 39.8. - PSMA PET (06/30/2022): Marked activity in the paramedian left lobe of the prostate.  No evidence of metastatic disease. - Bone scan (06/01/2023): Increased activity in the sternomanubrial region, bilateral shoulders, elbows, wrists and knees probably degenerative.  No definite bone metastasis. - Firmagon started on 05/31/2023 for elevated PSA of 145 (05/17/2023) - PSA improved to 111 (06/23/2023), and 87.8 (09/15/2023), testosterone 59 - Abiraterone 500 mg daily and prednisone 5 mg daily started on 10/24/2023, with decrease in PSA from 93 (10/17/2023) to 15 (11/08/2023).  Abiraterone dose reduced to 250 mg daily for better tolerance for fatigue  2.  Social/family history: - Lives at home with his wife.  Daughter stays with him and helps him with her day-to-day activities.  He uses walker to ambulate due to arthritis and decreased strength.  He is mostly sitting in a chair during daytime watching TV and gets up every 2-3 hours.  He cannot stand more than 3 minutes at a time.  He needs help with day-to-day activities including wearing his pants.  He fell about 2-3 times in the last 5 years.  He quit smoking in 1980.  No family history of malignancy that he could  remember.  Plan:  1.  Andrew resistant prostate [name]: - He is taking abiraterone 500 mg daily with prednisone.  He reports weakness has worsened. - Reviewed labs today: Normal LFTs.  Potassium is normal at 3.6.  CBC with mild leukocytosis from prednisone.  Blood pressure is 156/74. - As his PSA after 2 weeks of abiraterone improved to 15 from 93, I have recommended cutting back on the dose of abiraterone to 250 mg daily with prednisone 5 mg daily for better tolerance. - He is moving to [name] area next weekend.  He has an appointment to see medical oncology locally.  He received last Firmagon on 11/27/2023.  He will resume Firmagon/Eligard at the new clinic.  2.  Severe fatigue: - From combination of Firmagon and abiraterone.  Abiraterone dose is reduced.   No orders of the defined types were placed in this encounter.    Andrew [name],acting as a Neurosurgeon for Andrew Massed, MD.,have documented all relevant documentation on the behalf of Andrew Massed, MD,as directed by  Andrew Massed, MD while in the presence of Andrew Massed, MD.  I, Andrew Massed MD, have reviewed the above documentation for accuracy and completeness, and I agree with the above.      Andrew Massed, MD   4/10/20254:48 PM  CHIEF COMPLAINT/PURPOSE OF CONSULT:   Diagnosis: Prostate [name]   [name] Staging  Prostate [name] [facility]) Staging form: Prostate, AJCC 8th Edition - Clinical stage from 09/27/2023: cT2a, [name], [name], [name] - Unsigned    Prior Therapy: Andrew [name] monthly  Current Therapy: Abiraterone and prednisone  HISTORY OF PRESENT ILLNESS:   Oncology History   No history exists.      Andrew [name] is a 89 y.o. male presenting to clinic today for evaluation of prostate [name] at the request of Dr. Annabell [name].  Patient was diagnosed with prostate [name] on 01/06/2022 from persistent elevated PSA, which was 35 at that time. He has been receiving Firmagon injections  monthly since 05/17/23. His last PSA was down to 87.8 from 145 (prior to starting therapy). His last testosterone was at 59. This is in the setting of persistent LUTS. He was referred to me for possible second line agents. PET PSMA from 2023 did not show metastatic disease.   He underwent a NM whole body scan on 06/01/23 that found: Multifocal activity which is probably degenerative. No definite osseous metastatic disease.  Today, he states that he is doing well overall. His appetite level is at 75%. His energy level is at 10%.  PAST MEDICAL HISTORY:   Andrew [name] is a 89 y.o. male presenting to the clinic today for follow-up of prostate [name]. He was last seen by me on 11/08/23.  Today, he states that he is doing well overall. His appetite level is at 80%. His energy level is at 0%. He is accompanied by his daughter.  Daymien will be moving next weekend and has been called by his new oncologist's office at [facility] in [place], [place].   Andrew [name] notes difficulty walking and decreased activity due to left knee pain from arthritis and weakness. His weakness has worsened since starting Zytiga. He denies any other side effects from [place] and believes he can continue to take it. He has missed 1 dose of Zytiga, but is other wise taking it as prescribed. Andrew [name] uses a walker to ambulate.  PAST MEDICAL HISTORY:   Past Medical History: Past Medical History:  Diagnosis Date   Arthritis    BPH (benign prostatic hyperplasia)    Carotid artery stenosis    a. s/p L CEA in 03/2017   Family history of pancreatic [name]    Hypertension     Surgical History: Past Surgical History:  Procedure Laterality Date   CAROTID ENDARTERECTOMY Left 04/17/2017   COLONOSCOPY N/A 01/19/2017   Procedure: COLONOSCOPY;  Surgeon: Corbin Ade, MD;  [facility];  Service: Endoscopy;  Laterality: N/A;  1:15pm   ENDARTERECTOMY Left 04/17/2017   Procedure: ENDARTERECTOMY CAROTID - LEFT;  Surgeon:  Fransisco Hertz, MD;  Location: [facility] OR;  Service: Vascular;  Laterality: Left;   ESOPHAGOGASTRODUODENOSCOPY N/A 01/19/2017   Procedure: ESOPHAGOGASTRODUODENOSCOPY (EGD);  Surgeon: Corbin Ade, MD;  [facility];  Service: Endoscopy;  Laterality: N/A;   PATCH ANGIOPLASTY Left 04/17/2017   Procedure: PATCH ANGIOPLASTY;  Surgeon: Fransisco Hertz, MD;  Location: [facility] OR;  Service: Vascular;  Laterality: Left;    Social History: Social History   Socioeconomic History   Marital status: Married    Spouse name: Not on file   Number of children: 1   Years of education: Not on file   Highest education level: Not on file  Occupational History   Occupation: retired  Tobacco Use   Smoking status: Former    Current packs/day: 0.00    Types: Cigarettes    Start date: 1950    Quit date: 1980    Years since quitting: 45.3   Smokeless tobacco: Never  Vaping Use   Vaping status: Never Used  Substance and Sexual Activity   Alcohol use:  No   Drug use: No   Sexual activity: Never  Other Topics Concern   Not on file  Social History Narrative   Not on file   Social Drivers of Health   Financial Resource Strain: Not on file  Food Insecurity: No Food Insecurity (09/27/2023)   Hunger Vital Sign    Worried About Running Out of Food in the Last Year: Never true    Ran Out of Food in the Last Year: Never true  Transportation Needs: No Transportation Needs (09/27/2023)   PRAPARE - Administrator, Civil Service (Medical): No    Lack of Transportation (Non-Medical): No  Physical Activity: Not on file  Stress: Not on file  Social Connections: Not on file  Intimate Partner Violence: Not At Risk (09/27/2023)   Humiliation, Afraid, Rape, and Kick questionnaire    Fear of Current or Ex-Partner: No    Emotionally Abused: No    Physically Abused: No    Sexually Abused: No    Family History: Family History  Problem Relation Age of Onset   Hypertension Father    Diabetes Brother     Parkinson's disease Brother    Pancreatic [name] Cousin        pat first cousin   Colon [name] Neg Hx    Colon polyps Neg Hx     Current Medications:  Current Outpatient Medications:    abiraterone acetate (ZYTIGA) 250 MG tablet, Take 2 tablets (500 mg total) by mouth daily. Take on an empty stomach 1 hour before or 2 hours after a meal, Disp: 60 tablet, Rfl: 0   acetaminophen (TYLENOL) 500 MG tablet, Take 1,000 mg by mouth 2 (two) times daily as needed for moderate pain., Disp: , Rfl:    Docusate Sodium (DSS) 100 MG CAPS, Take by mouth., Disp: , Rfl:    losartan (COZAAR) 50 MG tablet, Take 50 mg by mouth daily., Disp: , Rfl:    meloxicam (MOBIC) 7.5 MG tablet, Take 7.5 mg by mouth daily., Disp: , Rfl:    Multiple Vitamins-Minerals (ICAPS LUTEIN & OMEGA-3) CAPS, Take 1 capsule by mouth daily., Disp: , Rfl:    predniSONE (DELTASONE) 5 MG tablet, Take 1 tablet (5 mg total) by mouth daily with breakfast., Disp: 30 tablet, Rfl: 2   senna-docusate (SENOKOT-S) 8.6-50 MG tablet, Take by mouth., Disp: , Rfl:    traMADol (ULTRAM) 50 MG tablet, Take 50 mg by mouth every 6 (six) hours as needed., Disp: , Rfl:    VITAMIN D PO, Take by mouth., Disp: , Rfl:   Current Facility-Administered Medications:    degarelix (FIRMAGON) injection 240 mg, 240 mg, Subcutaneous, Once,    degarelix (FIRMAGON) injection 80 mg, 80 mg, Subcutaneous, Q30 days, Bjorn Pippin, MD, 80 mg at 11/27/23 1057   Allergies: No Known Allergies  REVIEW OF SYSTEMS:   Review of Systems  Constitutional:  Negative for chills, fatigue and fever.  HENT:   Negative for lump/mass, mouth sores, nosebleeds, sore throat and trouble swallowing.   Eyes:  Negative for eye problems.  Respiratory:  Positive for shortness of breath. Negative for cough.   Cardiovascular:  Negative for chest pain, leg swelling and palpitations.  Gastrointestinal:  Negative for abdominal pain, constipation, diarrhea, nausea and vomiting.  Genitourinary:  Positive  for difficulty urinating. Negative for bladder incontinence, dysuria, frequency, hematuria and nocturia.   Musculoskeletal:  Positive for back pain (lower, 7/10 severity). Negative for arthralgias, flank pain, myalgias and neck pain.  Skin:  Negative  for itching and rash.  Neurological:  Negative for dizziness, headaches and numbness.  Hematological:  Does not bruise/bleed easily.  Psychiatric/Behavioral:  Negative for depression, sleep disturbance and suicidal ideas. The patient is not nervous/anxious.   All other systems reviewed and are negative.    VITALS:   Blood pressure (!) 152/71, pulse 75, temperature 98.4 F (36.9 C), temperature source Oral, resp. rate 19, SpO2 99%.  Wt Readings from Last 3 Encounters:  11/08/23 140 lb 10.5 oz (63.8 kg)  10/16/23 141 lb 8.6 oz (64.2 kg)  09/27/23 137 lb 12.6 oz (62.5 kg)    There is no height or weight on file to calculate BMI.  Performance status (ECOG): 2 - Symptomatic, <50% confined to bed  PHYSICAL EXAM:   Physical Exam Vitals and nursing note reviewed. Exam conducted with a chaperone present.  Constitutional:      Appearance: Normal appearance.  Cardiovascular:     Rate and Rhythm: Normal rate and regular rhythm.     Pulses: Normal pulses.     Heart sounds: Normal heart sounds.  Pulmonary:     Effort: Pulmonary effort is normal.     Breath sounds: Normal breath sounds.  Abdominal:     Palpations: Abdomen is soft. There is no hepatomegaly, splenomegaly or mass.     Tenderness: There is no abdominal tenderness.  Musculoskeletal:     Right lower leg: No edema.     Left lower leg: No edema.  Lymphadenopathy:     Cervical: No cervical adenopathy.     Right cervical: No superficial, deep or posterior cervical adenopathy.    Left cervical: No superficial, deep or posterior cervical adenopathy.     Upper Body:     Right upper body: No supraclavicular or axillary adenopathy.     Left upper body: No supraclavicular or axillary  adenopathy.  Neurological:     General: No focal deficit present.     Mental Status: He is alert and oriented to person, place, and time.  Psychiatric:        Mood and Affect: Mood normal.        Behavior: Behavior normal.     LABS:   CBC    Component Value Date/Time   WBC 11.9 (H) 11/30/2023 1059   RBC 3.53 (L) 11/30/2023 1059   HGB 11.5 (L) 11/30/2023 1059   HCT 34.1 (L) 11/30/2023 1059   PLT 266 11/30/2023 1059   MCV 96.6 11/30/2023 1059   MCH 32.6 11/30/2023 1059   MCHC 33.7 11/30/2023 1059   RDW 12.4 11/30/2023 1059   LYMPHSABS 1.2 11/30/2023 1059   MONOABS 0.6 11/30/2023 1059   EOSABS 0.1 11/30/2023 1059   BASOSABS 0.0 11/30/2023 1059    CMP    Component Value Date/Time   NA 136 11/30/2023 1059   NA 142 09/15/2023 0824   K 3.6 11/30/2023 1059   CL 101 11/30/2023 1059   CO2 26 11/30/2023 1059   GLUCOSE 126 (H) 11/30/2023 1059   BUN 21 11/30/2023 1059   BUN 25 09/15/2023 0824   CREATININE 0.67 11/30/2023 1059   CALCIUM 9.3 11/30/2023 1059   PROT 6.6 11/30/2023 1059   PROT 7.2 09/15/2023 0824   ALBUMIN 3.6 11/30/2023 1059   ALBUMIN 4.4 09/15/2023 0824   AST 17 11/30/2023 1059   ALT 12 11/30/2023 1059   ALKPHOS 70 11/30/2023 1059   BILITOT 0.6 11/30/2023 1059   BILITOT 0.4 09/15/2023 0824   GFRNONAA >60 11/30/2023 1059   GFRAA >60 10/02/2017  1610     No results found for: "CEA1", "CEA" / No results found for: "CEA1", "CEA" Lab Results  Component Value Date   PSA1 87.8 (H) 09/15/2023   No results found for: "CAN199" No results found for: "CAN125"  Lab Results  Component Value Date   TOTALPROTELP 7.3 09/27/2023   ALBUMINELP 3.7 09/27/2023   A1GS 0.3 09/27/2023   A2GS 1.1 (H) 09/27/2023   BETS 1.3 09/27/2023   GAMS 0.8 09/27/2023   MSPIKE Not Observed 09/27/2023   SPEI Comment 09/27/2023   Lab Results  Component Value Date   TIBC 314 09/27/2023   FERRITIN 127 09/27/2023   IRONPCTSAT 14 (L) 09/27/2023   Lab Results  Component Value Date    LDH 137 09/27/2023     STUDIES:   No results found.

## 2023-11-30 NOTE — Progress Notes (Signed)
 Patient is taking Zytiga as prescribed. He has not missed any doses and reports no side effects at this time.

## 2023-11-30 NOTE — Patient Instructions (Signed)
 [facility] at [facility] Discharge Instructions   You were seen and examined today by Dr. Ellin Saba.  He reviewed the results of your lab work which are normal/stable. Your PSA has come down to 15 from 93.   You can cut down on the Zytiga to one pill a day.   You will transfer care to oncology in [place], [place].       Thank you for choosing [facility] at [facility] to provide your oncology and hematology care.  To afford each patient quality time with our provider, please arrive at least 15 minutes before your scheduled appointment time.   If you have a lab appointment with the Cancer Center please come in thru the Main Entrance and check in at the main information desk.  You need to re-schedule your appointment should you arrive 10 or more minutes late.  We strive to give you quality time with our providers, and arriving late affects you and other patients whose appointments are after yours.  Also, if you no show three or more times for appointments you may be dismissed from the clinic at the providers discretion.     Again, thank you for choosing [facility].  Our hope is that these requests will decrease the amount of time that you wait before being seen by our physicians.       _____________________________________________________________  Should you have questions after your visit to [facility], please contact our office at [phone] and follow the prompts.  Our office hours are 8:00 a.m. and 4:30 p.m. Monday - Friday.  Please note that voicemails left after 4:00 p.m. may not be returned until the following business day.  We are closed weekends and major holidays.  You do have access to a nurse 24-7, just call the main number to the clinic [phone] and do not press any options, hold on the line and a nurse will answer the phone.    For prescription refill requests, have your pharmacy contact our office and  allow 72 hours.    Due to Covid, you will need to wear a mask upon entering the hospital. If you do not have a mask, a mask will be given to you at the Main Entrance upon arrival. For doctor visits, patients may have 1 support person age 3 or older with them. For treatment visits, patients can not have anyone with them due to social distancing guidelines and our immunocompromised population.

## 2023-12-08 ENCOUNTER — Other Ambulatory Visit: Payer: Self-pay

## 2023-12-08 ENCOUNTER — Other Ambulatory Visit: Payer: Self-pay | Admitting: Hematology

## 2023-12-08 DIAGNOSIS — C61 Malignant neoplasm of prostate: Secondary | ICD-10-CM

## 2023-12-08 NOTE — Progress Notes (Signed)
 Specialty Pharmacy Refill Coordination Note  Andrew [name] is a 89 y.o. male contacted today regarding refills of specialty medication(s) Abiraterone  Acetate (ZYTIGA )   Spoke with patient's daughter.  Patient requested Delivery   Delivery date: 12/13/23   Verified address: [address] [place] [address]   Medication will be filled on 04.22.25.   Per daughter they are moving in about a week and will need to transfer care. Will fill at least one more time while they are transitioning care.  This fill date is pending response to refill request from provider. Patient is aware and if they have not received fill by intended date they must follow up with pharmacy.

## 2023-12-11 ENCOUNTER — Other Ambulatory Visit: Payer: Self-pay

## 2023-12-11 MED ORDER — ABIRATERONE ACETATE 250 MG PO TABS
500.0000 mg | ORAL_TABLET | Freq: Every day | ORAL | 0 refills | Status: DC
  Filled 2023-12-11: qty 60, 30d supply, fill #0

## 2023-12-12 ENCOUNTER — Other Ambulatory Visit (HOSPITAL_COMMUNITY): Payer: Self-pay

## 2023-12-12 ENCOUNTER — Other Ambulatory Visit: Payer: Self-pay

## 2023-12-13 ENCOUNTER — Telehealth: Payer: Self-pay

## 2023-12-13 NOTE — Telephone Encounter (Signed)
 Called pt daughter to inform her that MD Cancel no longer works at [facility] pt daughter stated that's fine she still wants the referral to that facility

## 2023-12-13 NOTE — Telephone Encounter (Signed)
 Has an appointment for firmagon  injection 04/30. Pt moving to [place] before his upcoming appt daughter wants to know can we send in a referral to a new urologist MD Katina Parlor affiliated w/ [facility] and [facility] advised pt that we would put in a referral and notify MD

## 2023-12-13 NOTE — Telephone Encounter (Signed)
 Okay referral was sent

## 2023-12-20 ENCOUNTER — Ambulatory Visit

## 2023-12-21 ENCOUNTER — Telehealth: Payer: Self-pay

## 2023-12-21 NOTE — Telephone Encounter (Signed)
 Pt daughter called to check on previously sent referral per Vicci Graff the referral was sent, but the file was too large so it had to be sent again I attempted to call pt daughter back however vm box was full and unable to leave message

## 2023-12-22 ENCOUNTER — Other Ambulatory Visit: Payer: PPO

## 2023-12-22 ENCOUNTER — Ambulatory Visit: Payer: PPO

## 2023-12-28 ENCOUNTER — Ambulatory Visit: Payer: PPO | Admitting: Urology

## 2024-01-03 ENCOUNTER — Telehealth: Payer: Self-pay

## 2024-01-03 NOTE — Telephone Encounter (Signed)
 Daughter is made aware and voiced understanding. "spoke with [facility] they said she needs to speak with misty - I refaxed it again today , it was sent on 4/23 5/1 5/5 and 5/7 and 5/14"

## 2024-01-04 ENCOUNTER — Ambulatory Visit: Payer: PPO | Admitting: Urology

## 2024-01-09 ENCOUNTER — Other Ambulatory Visit (HOSPITAL_COMMUNITY): Payer: Self-pay

## 2024-01-11 ENCOUNTER — Other Ambulatory Visit (HOSPITAL_COMMUNITY): Payer: Self-pay

## 2024-01-12 ENCOUNTER — Other Ambulatory Visit (HOSPITAL_COMMUNITY): Payer: Self-pay

## 2024-01-16 ENCOUNTER — Other Ambulatory Visit: Payer: Self-pay

## 2024-01-16 ENCOUNTER — Other Ambulatory Visit: Payer: Self-pay | Admitting: Pharmacy Technician

## 2024-01-16 NOTE — Progress Notes (Signed)
 Disenrolled; Spoke with Daughter Rinda & they have moved to [place], [place] & they seen a New MD on Friday 5/23 & That MD will be taking over care.

## 2024-01-30 ENCOUNTER — Other Ambulatory Visit: Payer: Self-pay

## 2024-02-07 ENCOUNTER — Other Ambulatory Visit (HOSPITAL_COMMUNITY): Payer: Self-pay

## 2024-05-08 IMAGING — PT NM PET TUM IMG SKULL BASE T - THIGH
1 series · 1 of 1 positions shown · non-contrast
Comparison: None Available.

CLINICAL DATA: Initial treatment strategy for prostate carcinoma.
Markedly elevated PSA level.

EXAM:
NUCLEAR MEDICINE PET SKULL BASE TO THIGH
TECHNIQUE: 8.9 mCi F18 Piflufolastat (Pylarify) was injected intravenously.
Full-ring PET imaging was performed from the skull base to thigh
after the radiotracer. CT data was obtained and used for attenuation
correction and anatomic localization.

[Series 1111: results mm oncology reading · 1.0mm · 1.02mm/px · 1 of 1 slices shown]
[im 1/1]
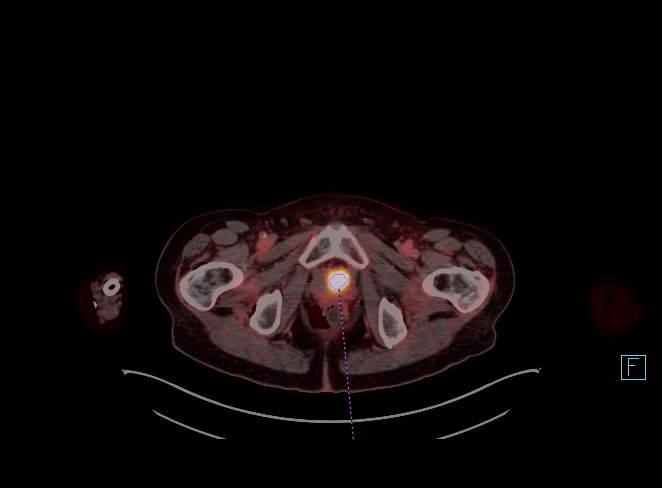

[1 of 1 positions shown; findings below may reference images not displayed]

FINDINGS: NECK

No radiotracer activity in neck lymph nodes.

Incidental CT finding: None

CHEST

No radiotracer accumulation within mediastinal or hilar lymph nodes.
No suspicious pulmonary nodules on the CT scan.

Incidental CT finding: Aortic and coronary atherosclerotic
calcification incidentally noted.

ABDOMEN/PELVIS

Prostate: Normal in size, with focal area of intense radiotracer
accumulation in the central gland which has SUV max of 24.7,
consistent with primary prostate carcinoma.

Lymph nodes: No abnormal radiotracer accumulation within pelvic or
abdominal nodes.

Liver: No evidence of liver metastasis.

Incidental CT finding: Diverticulosis with greatest involvement of
the descending sigmoid colon, without evidence of diverticulitis.
Aortic atherosclerotic calcification incidentally noted.

SKELETON

No focal  activity to suggest skeletal metastasis.
IMPRESSION: Focal area of intense radiotracer accumulation in the central
portion of the prostate, consistent with primary prostate carcinoma.

No evidence of metastatic disease.

Aortic Atherosclerosis ([id number]).

## 2024-05-22 DEATH — deceased
